# Patient Record
Sex: Male | Born: 1935 | Race: White | Hispanic: No | Marital: Married | Smoking: Former smoker
Health system: Southern US, Academic
[De-identification: ages and names within clinical notes are randomized; demographics above are authoritative.]

## PROBLEM LIST (undated history)

## (undated) DIAGNOSIS — H9190 Unspecified hearing loss, unspecified ear: Secondary | ICD-10-CM

## (undated) DIAGNOSIS — I10 Essential (primary) hypertension: Secondary | ICD-10-CM

## (undated) DIAGNOSIS — K219 Gastro-esophageal reflux disease without esophagitis: Secondary | ICD-10-CM

## (undated) DIAGNOSIS — J45909 Unspecified asthma, uncomplicated: Secondary | ICD-10-CM

## (undated) HISTORY — DX: Unspecified hearing loss, unspecified ear: H91.90

## (undated) HISTORY — DX: Unspecified asthma, uncomplicated: J45.909

## (undated) HISTORY — DX: Gastro-esophageal reflux disease without esophagitis: K21.9

## (undated) HISTORY — DX: Essential (primary) hypertension: I10

## (undated) NOTE — Progress Notes (Signed)
Formatting of this note is different from the original.    Nephrology f/u    Patient Name: Kurt Hayes Date of Service: 05/30/2022   Patient DOB: 1930-04-02 Provider : Lyla Glassing Provider Schedule   Patient MRN: [id number] Location: [place]     Reason for visit     No chief complaint on file.    History of Present Illness   Pt has stage 4 CKD. Pt noticeably hard of hearing but does not having hearing aids and has no desire for them. Patient reports his most recent BP reading was 124/65. Pt takes metoprolol 25 mg BID, telmisartan 80 mg.     No recent infections or hospitalizations. Denies N/V/D, fatigue, leg swelling.  No changes in weight and appetite. Drinks water throughout the day. Pt endorses some urinary urgency for about one month. No dysuria, hematuria, or foamy urine.   Produces adequate amount of urine.     Pt has hx of bladder cancer, has followed with oncology but reports his oncologist recently moved and he is unsure of his follow up plan. Next appointment is in [place] in June 13.     Past Medical History:   Diagnosis Date    Asthma     Chronic kidney disease stage 4 (HCC)     Chronic obstructive pulmonary disease ([place])     Essential hypertension     Gastroesophageal reflux disease     Hyperlipidemia     Osteoporosis     Stroke [facility])      Past Surgical History:   Procedure Laterality Date    OTHER SURGICAL HISTORY      polyps removed from nose x3, prostate surgery, salivary gland removed, tumor removed from bladder, non cancerous.     Social History     Tobacco Use    Smoking status: Unknown    Smokeless tobacco: Not on file   Substance Use Topics    Alcohol use: No     No family history on file.    Aspirin, Wheat bran, and Prednisone    No outpatient medications have been marked as taking for the 05/30/22 encounter (Office Visit) with [place].     Chemistry   Lab Units 05/27/22  0000 02/21/22  0000 11/21/21  0000 09/10/21  0000 08/13/21  0000 05/10/21  0000  10/27/20  0000 07/27/20  0000   SODIUM  144 145 145 144 145 145 143 146   POTASSIUM  4.1 3.9* 4.0 3.7 4.5 4.2 4.1 4.1   CHLORIDE  110.0* 110.0* 107.0 109.0* 107.0 109.0* 104.0 110*   CO2 mmol/L 19 17 23 21   --  23 21* 20*   BUN mg/dL 39* 39* 43* 37* 54* 30* 49* 41*   CREATININE mg/dL 2.39* 2.11* 1.98* 2.22* 2.60* 2.24* 2.17* 2.05*   EGFRNAFR  25  --  31  --   --   --   --  28   EGFRAFR   --   --   --   --   --   --   --  32   CALCIUM mg/dL 10.3 10.6* 10.8* 10.5 11.0* 10.8* 10.9* 10.8*   PHOSPHORUS  2.6  --  2.9 2.4  --  2.2 2.9  --    ALBUMIN g/dL 4.4 4.4 4.5 4.4 4.5 4.2 4.6 4.6     Lab Results   Component Value Date    WBC 7.7 05/27/2022    RBC 4.0 (L) 04/05/2019    HGB  13.1 (A) 05/27/2022    HCT 38.5 (A) 05/27/2022    MCV 97.0 02/21/2022    PLT 180 05/27/2022       05/30/2022   Urine Microscopy       Constitutional:  Negative for chills and fever.   HENT:  Negative for ear pain and tinnitus.    Eyes:  Negative for photophobia and pain.   Respiratory:  Negative for hemoptysis, shortness of breath and stridor.    Cardiovascular:  Negative for chest pain and leg swelling.   Gastrointestinal:  Negative for abdominal pain and melena.   Genitourinary:  Negative for frequency.   Musculoskeletal:  Negative for falls and myalgias.   Skin:  Negative for itching.   Neurological:  Negative for tingling, speech change and focal weakness.   Endo/Heme/Allergies:  Negative for environmental allergies. Does not bruise/bleed easily.   Psychiatric/Behavioral:  Negative for substance abuse and suicidal ideas.      BP 119/63 (BP Location: Left upper arm, Patient Position: Sitting)   Pulse 62   Ht 6' (1.829 m)   Wt 205 lb (93 kg)   BMI 27.80 kg/m     Constitutional: He is oriented to person, place, and time.   HEENT:   Nose: Nose normal.   Mouth/Throat: Oropharynx is clear and moist.   Eyes: EOM are normal. Pupils are equal, round, and reactive to light.   Neck: No tracheal deviation present. No thyroid mass present.    Cardiovascular:  Normal rate.      Exam reveals no friction rub.       Pulmonary/Chest: Effort normal. He exhibits no tenderness.   Abdominal: Soft. There is no guarding.   Musculoskeletal: He exhibits no deformity.   Neurological: He is alert and oriented to person, place, and time.   Skin: Skin is warm. He is not diaphoretic. No erythema.     Assessment & Plan     CKD 4  Cr is at 2.3 now with eGFr  25 , baseline is typically at eGFR 25-30   2/2 hypertensive nephrosclerosis  Discussed risks/ben of SGLT2i , he wants to think about it   Will check his renal function again in 6 mo and f/u    Met acidosis  Will give NaHco3 650 BID    Primary hyperparathyroidism  Asymptomatic   HyperCa, mild with low Phos and elevtaed PTH  Will defer to primary  I would just watch symptoms   Not a candidate for surgery     RTC: 6 months  Electronically signed by Fidela Salisbury, MD at 05/30/2022 11:56 AM EST

## (undated) NOTE — Progress Notes (Signed)
Formatting of this note is different from the original.  Images from the original note were not included.      [facility]    Kurt Hayes  DOB: 10-25-1929 (67 year old male)  MRN: [id number]    DATE OF CLINIC VISIT: 10/20/2020    PRIMARY CARE PHYSICIAN: Ralene Ok III, DO     PRIMARY CARDIOLOGIST: Dr. Ferrel Logan, MD        CHIEF COMPLAINT: Cardiac Arrythmia    HISTORY OF PRESENT ILLNESS: HPI  Kurt Hayes is a 31 year old male with a history of hypertension, chronic obstructive pulmonary disease, dyslipidemia.     Last Office visit was 02/14/2020 and no changes were made. Patient returns for a past due follow up of Cardiac Arrhythmia.    Patient states today he is doing well. He is accompanied to the visit by his wife.  Patient has a poor historian.  He has had no new problems since last office visit. He continues to have some instability in his gait when walking. He does not report any dizziness or pre-syncope. States that his blood pressure has been stable at home. He does report some numbness in bilateral feet.  Patient states that he sees neurologist next month for instability when walking.  Patient continues with shortness of breath at rest and upon exertion but this is unchanged since previous visit. Patient denies chest pain, tightness, or pressure. Denies N/V, fever, or chills. Denies syncope, near syncope, or palpitations. Denies edema, PND or orthopnea. Denies any TIA like symptoms. Denies any claudication like symptoms or any non healing wounds. Compliant with medications.     Cardiac Testing:  Echocardiogram on 12/13/2019  EF 65%  Grade 1 Diastolic Dysfunction  Mildly Dilated Left Atrial Cavity  Minimal MR  Minimal TR  Lexi MPI on   LHC on   Extended Holter Monitor on 11/28/2019  NSR over a period of 1 month    Cardiac History:   Patient Active Problem List    Diagnosis   ? Gait disturbance   ? Chronic obstructive pulmonary disease (HCC)   ? Hyperlipidemia, mixed   ? Asthma   ? Arrhythmia   ?  Dizziness     Note Last Updated: 12/06/2019     CDU: minimal disease. No stenosis present 10/06/2019    ? S/P TURP (status post transurethral resection of prostate)   ? Angina pectoris [facility])     Note Last Updated: 12/06/2019     Formatting of this note might be different from the original.  IMO Load 2016 R1.3    ? Dyspnea on exertion   ? Disequilibrium   ? Essential hypertension, benign     Pre-Visit Medications  Outpatient Medications Prior to Visit   Medication Sig Dispense Refill   ? albuterol (Proventil HFA/Ventolin HFA) 108 (90 Base) MCG/ACT inhaler Inhale 1-2 puffs into the lungs every 6 (six) hours as needed for wheezing.     ? albuterol (Proventil/Accuneb) 0.083% nebulizer solution Take 1 ampule by nebulization every 6 (six) hours.     ? allopurinol (ZYLOPRIM) 300 MG tablet Take 100 mg by mouth 2 (two) times a day.      ? bumetanide (Bumex) 1 MG tablet Take 1 mg by mouth daily.     ? cholecalciferol (Vitamin D3) 50 MCG (2000 UT) tablet Take 50 mcg by mouth daily.     ? clopidogrel (PLAVIX) 75 MG tablet Take 75 mg by mouth daily.     ? Coenzyme Q10 (  CoQ10) 100 MG Cap Take 1 tablet by mouth daily.     ? finasteride (PROSCAR) 5 MG tablet Take 5 mg by mouth daily.     ? fluticasone-vilanterol (Breo Ellipta) 100-25 MCG/INH inhaler Inhale 1 puff into the lungs daily. Rinse mouth with water after inhalation     ? lamoTRIgine (LaMICtal) 25 MG tablet Take 25 mg by mouth 2 (two) times a day.     ? Magnesium 400 MG Tab Take 1 tablet by mouth daily.     ? metoprolol succinate (Toprol-XL) 50 MG 24 hr tablet Take 25 mg by mouth 2 (two) times a day.     ? Misc Natural Products (OSTEO BI-FLEX TRIPLE STRENGTH PO) Take 1 tablet by mouth daily.     ? Multiple Vitamins-Minerals (OCUVITE PO) Take 1 tablet by mouth daily.     ? nitroglycerin (NITROSTAT) 0.4 MG SL tablet Place 0.4 mg under the tongue every 5 (five) minutes as needed for chest pain. If no relief after 3 doses, call 911.     ? rosuvastatin (Crestor) 20 MG tablet Take  20 mg by mouth daily.     ? sucralfate (Carafate) 1 g tablet Take 1 g by mouth 4 (four) times a day.     ? telmisartan (Micardis) 80 MG tablet Take 80 mg by mouth daily.     ? FLUoxetine (PROzac) 20 MG capsule Take 20 mg by mouth daily.     ? budesonide-formoterol (Symbicort) 160-4.5 MCG/ACT inhaler Inhale 2 puffs into the lungs 2 (two) times a day.     ? gabapentin (Neurontin) 100 MG capsule Take 100 mg by mouth 3 (three) times a day.     ? losartan (Cozaar) 50 MG tablet Take 50 mg by mouth daily.     ? pantoprazole (PROTONIX) 40 MG tablet Take 40 mg by mouth every morning before breakfast.     ? tiotropium-olodaterol (Stiolto Respimat) 2.5-2.5 MCG/ACT inhaler Inhale 2 puffs into the lungs daily.       Other Past History:  Past Medical History:   Diagnosis Date   ? Acid reflux    ? Asthma    ? Cerebrovascular accident [facility])    ? Chronic obstructive pulmonary disease (HCC)    ? Depression    ? Gout    ? Hiatal hernia    ? Hypercholesteremia    ? Hypertensive disorder    ? Lactose intolerance    ? Seasonal allergies      Past Surgical History:   Procedure Laterality Date   ? BLADDER TUMOR EXCISION     ? COLONOSCOPY     ? ESOPHAGOGASTRODUODENOSCOPY     ? ESOPHAGOGASTRODUODENOSCOPY N/A 04/19/2015    Procedure: EGD ;  Surgeon: Frankey Shown, MD;  Location: [facility];  Service: Gastroenterology;  Laterality: N/A;   ? nasal polyps     ? PROSTATE SURGERY       Family History   Problem Relation Age of Onset   ? Heart attack Mother    ? Heart disease Mother    ? Heart attack Father    ? Heart disease Father    ? Heart attack Sister    ? Heart attack Sister    ? Stroke Sister    ? Cancer Sister      Social History     Tobacco Use   ? Smoking status: Former Smoker     Packs/day: 1.00     Years: 15.00     Pack years:  15.00     Types: Cigarettes     Quit date: 04/18/1995     Years since quitting: 25.5   ? Smokeless tobacco: Never Used   Substance Use Topics   ? Alcohol use: No     Allergies   Allergen Reactions    ? Aspirin Shortness Of Breath     "asthma"   ? Wheat Bran Shortness Of Breath     "asthma"     Labs/Xrays, pertinent studies, and any available records were reviewed and results/ impressions were discussed with patient in detail.    Review of Systems:   Review of Systems   Constitutional: Negative for fever, night sweats and weight gain.   HENT: Negative for nosebleeds and tinnitus.    Eyes: Negative for double vision.   Cardiovascular: Positive for dyspnea on exertion. Negative for chest pain, claudication, irregular heartbeat, leg swelling, near-syncope, orthopnea, palpitations, paroxysmal nocturnal dyspnea and syncope.   Respiratory: Positive for shortness of breath. Negative for cough.    Hematologic/Lymphatic: Negative for bleeding problem. Does not bruise/bleed easily.   Skin: Negative for poor wound healing.   Musculoskeletal: Negative for myalgias.   Gastrointestinal: Negative for hematemesis, hematochezia and melena.   Genitourinary: Negative for nocturia.   Neurological: Positive for disturbances in coordination. Negative for excessive daytime sleepiness, dizziness, headaches and light-headedness.   Psychiatric/Behavioral: Positive for memory loss. Negative for depression. The patient is not nervous/anxious.    Allergic/Immunologic: Negative.      Vital Signs:   Measurements Weight: 90.6 kg (199 lb 12.8 oz), Height: 6' (182.9 cm), BSA (Calculated - sq m): 2.15 sq meters, BMI (Calculated): 27.1    Vitals BP: 110/62, BP Location: Right arm, Patient Position: Sitting, Pulse: 69, Resp: 18, Temp: 98.4 F (36.9 C)    Pulse Oximetry Room Air At Rest: 96    Pain Assessment Pain Score: 0     PHYSICAL EXAM:   Physical Exam  Vitals and nursing note reviewed.   Constitutional:       General: He is not in acute distress.     Appearance: Normal appearance. He is well-developed.   HENT:      Head: Normocephalic and atraumatic.   Eyes:      General: No scleral icterus.     Conjunctiva/sclera: Conjunctivae normal.       Pupils: Pupils are equal, round, and reactive to light.   Neck:      Vascular: No JVD.   Cardiovascular:      Rate and Rhythm: Normal rate and regular rhythm.      Pulses:           Carotid pulses are 2+ on the right side and 2+ on the left side.       Radial pulses are 2+ on the right side and 2+ on the left side.        Posterior tibial pulses are 2+ on the right side and 2+ on the left side.      Heart sounds: No murmur heard.    No friction rub. No gallop.   Pulmonary:      Effort: Pulmonary effort is normal.      Breath sounds: Normal breath sounds.   Abdominal:      Palpations: Abdomen is soft.   Musculoskeletal:         General: Normal range of motion.      Cervical back: Normal range of motion.      Right lower leg: No edema.  Left lower leg: No edema.   Skin:     General: Skin is warm and dry.   Neurological:      Mental Status: He is alert and oriented to person, place, and time.   Psychiatric:         Behavior: Behavior normal.         Thought Content: Thought content normal.         Judgment: Judgment normal.     Visit Diagnoses:    1. Other cardiac arrhythmia  Follows up with Neurology in [facility].    Will follow-up in 6 months, or sooner if needed.    2. Essential hypertension, benign  BP 110/62 well controlled, continue antihypertensives. Home BP monitoring encouraged. BP will be re-assessed at next clinic visit.     - EKG 12 lead; AFlutter rate of 69    3. Angina pectoris [facility])  Patient denies any chest pain related to his heart, he states this could be caused by his kidney issues but he is unsure.    4. Hyperlipidemia, mixed  Tolerating Statin. Followed by PCP    Summary and Recommendations:   The current medical regimen is effective;  continue present plan and medications.   I reviewed my assessment and plans for follow up in detail with the patient and/or family who verbalized an understanding and agrees with them    Activities:  As tolerated  and focus on diet and exercise as discussed    Final  Med List:  Outpatient Encounter Medications as of 10/20/2020   Medication Sig Dispense Refill   ? albuterol (Proventil HFA/Ventolin HFA) 108 (90 Base) MCG/ACT inhaler Inhale 1-2 puffs into the lungs every 6 (six) hours as needed for wheezing.     ? albuterol (Proventil/Accuneb) 0.083% nebulizer solution Take 1 ampule by nebulization every 6 (six) hours.     ? allopurinol (ZYLOPRIM) 300 MG tablet Take 100 mg by mouth 2 (two) times a day.      ? bumetanide (Bumex) 1 MG tablet Take 1 mg by mouth daily.     ? cholecalciferol (Vitamin D3) 50 MCG (2000 UT) tablet Take 50 mcg by mouth daily.     ? clopidogrel (PLAVIX) 75 MG tablet Take 75 mg by mouth daily.     ? Coenzyme Q10 (CoQ10) 100 MG Cap Take 1 tablet by mouth daily.     ? finasteride (PROSCAR) 5 MG tablet Take 5 mg by mouth daily.     ? fluticasone-vilanterol (Breo Ellipta) 100-25 MCG/INH inhaler Inhale 1 puff into the lungs daily. Rinse mouth with water after inhalation     ? lamoTRIgine (LaMICtal) 25 MG tablet Take 25 mg by mouth 2 (two) times a day.     ? Magnesium 400 MG Tab Take 1 tablet by mouth daily.     ? metoprolol succinate (Toprol-XL) 50 MG 24 hr tablet Take 25 mg by mouth 2 (two) times a day.     ? Misc Natural Products (OSTEO BI-FLEX TRIPLE STRENGTH PO) Take 1 tablet by mouth daily.     ? Multiple Vitamins-Minerals (OCUVITE PO) Take 1 tablet by mouth daily.     ? nitroglycerin (NITROSTAT) 0.4 MG SL tablet Place 0.4 mg under the tongue every 5 (five) minutes as needed for chest pain. If no relief after 3 doses, call 911.     ? rosuvastatin (Crestor) 20 MG tablet Take 20 mg by mouth daily.     ? sucralfate (Carafate) 1 g tablet Take 1 g  by mouth 4 (four) times a day.     ? telmisartan (Micardis) 80 MG tablet Take 80 mg by mouth daily.     ? [DISCONTINUED] FLUoxetine (PROzac) 20 MG capsule Take 20 mg by mouth daily.     ? [DISCONTINUED] budesonide-formoterol (Symbicort) 160-4.5 MCG/ACT inhaler Inhale 2 puffs into the lungs 2 (two) times a day.     ?  [DISCONTINUED] gabapentin (Neurontin) 100 MG capsule Take 100 mg by mouth 3 (three) times a day.     ? [DISCONTINUED] losartan (Cozaar) 50 MG tablet Take 50 mg by mouth daily.     ? [DISCONTINUED] pantoprazole (PROTONIX) 40 MG tablet Take 40 mg by mouth every morning before breakfast.     ? [DISCONTINUED] tiotropium-olodaterol (Stiolto Respimat) 2.5-2.5 MCG/ACT inhaler Inhale 2 puffs into the lungs daily.     Last reviewed on 10/20/2020  2:08 PM by Rowe Pavy      Follow-up:  No follow-ups on file.    Portions of this chart may have been created with MModal voice recognition software.  Occasional wrong-word or ?sound-alike? substitutions may have occurred due to the inherent limitations of voice recognition software, and may have been missed even after my proofreading. Please read the chart carefully and recognize, using context, where these substitutions have occurred.     Electronically signed by:  Eula Listen, NP  10/20/2020      Electronically signed by Hans Eden, NP at 10/20/2020  3:04 PM EDT

---

## 2000-02-11 DIAGNOSIS — I679 Cerebrovascular disease, unspecified: Secondary | ICD-10-CM | POA: Insufficient documentation

## 2000-02-26 DIAGNOSIS — I10 Essential (primary) hypertension: Secondary | ICD-10-CM | POA: Insufficient documentation

## 2000-04-07 DIAGNOSIS — R42 Dizziness and giddiness: Secondary | ICD-10-CM | POA: Insufficient documentation

## 2002-02-23 DIAGNOSIS — Z79899 Other long term (current) drug therapy: Secondary | ICD-10-CM | POA: Insufficient documentation

## 2002-02-23 DIAGNOSIS — R918 Other nonspecific abnormal finding of lung field: Secondary | ICD-10-CM | POA: Insufficient documentation

## 2003-08-26 DIAGNOSIS — M546 Pain in thoracic spine: Secondary | ICD-10-CM | POA: Insufficient documentation

## 2004-04-30 DIAGNOSIS — M549 Dorsalgia, unspecified: Secondary | ICD-10-CM | POA: Insufficient documentation

## 2004-08-13 DIAGNOSIS — I209 Angina pectoris, unspecified: Secondary | ICD-10-CM | POA: Insufficient documentation

## 2005-01-29 DIAGNOSIS — J13 Pneumonia due to Streptococcus pneumoniae: Secondary | ICD-10-CM | POA: Insufficient documentation

## 2006-08-29 DIAGNOSIS — J329 Chronic sinusitis, unspecified: Secondary | ICD-10-CM | POA: Insufficient documentation

## 2010-08-23 DEATH — deceased

## 2015-10-30 DIAGNOSIS — Z9079 Acquired absence of other genital organ(s): Secondary | ICD-10-CM | POA: Insufficient documentation

## 2015-10-30 DIAGNOSIS — N401 Enlarged prostate with lower urinary tract symptoms: Secondary | ICD-10-CM | POA: Insufficient documentation

## 2017-10-13 DIAGNOSIS — R0600 Dyspnea, unspecified: Secondary | ICD-10-CM | POA: Insufficient documentation

## 2017-12-01 DIAGNOSIS — N289 Disorder of kidney and ureter, unspecified: Secondary | ICD-10-CM | POA: Insufficient documentation

## 2017-12-01 DIAGNOSIS — M109 Gout, unspecified: Secondary | ICD-10-CM | POA: Insufficient documentation

## 2017-12-01 DIAGNOSIS — I10 Essential (primary) hypertension: Secondary | ICD-10-CM | POA: Insufficient documentation

## 2017-12-01 DIAGNOSIS — J449 Chronic obstructive pulmonary disease, unspecified: Secondary | ICD-10-CM | POA: Insufficient documentation

## 2019-09-13 DIAGNOSIS — L988 Other specified disorders of the skin and subcutaneous tissue: Secondary | ICD-10-CM | POA: Insufficient documentation

## 2019-10-28 ENCOUNTER — Ambulatory Visit (HOSPITAL_COMMUNITY): Payer: Self-pay

## 2019-12-06 DIAGNOSIS — E782 Mixed hyperlipidemia: Secondary | ICD-10-CM | POA: Insufficient documentation

## 2019-12-06 DIAGNOSIS — J45909 Unspecified asthma, uncomplicated: Secondary | ICD-10-CM | POA: Insufficient documentation

## 2019-12-06 DIAGNOSIS — I499 Cardiac arrhythmia, unspecified: Secondary | ICD-10-CM | POA: Insufficient documentation

## 2020-02-02 IMAGING — MR MRI LUMBAR SPINE WITHOUT CONTRAST
5 of 6 series · 32 of 48 positions shown · IV contrast (gadolinium)
Comparison: None available.

﻿EXAM:  MRI LUMBAR SPINE WITHOUT CONTRAST
INDICATION: Lower back pain with bilateral lower extremity radiculopathy.
TECHNIQUE: Multiplanar multisequential MRI of the lumbar spine was performed without gadolinium contrast.

[Series 9: T2 · sagittal · 4.0mm · 0.94mm/px · 6 of 13 slices shown (1 of 3)]
[im 1/13]
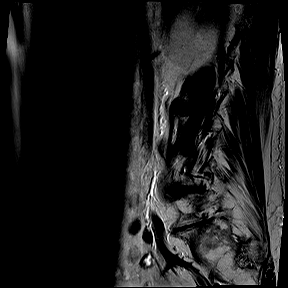
[im 3/13]
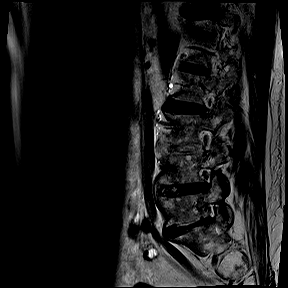
[im 5/13]
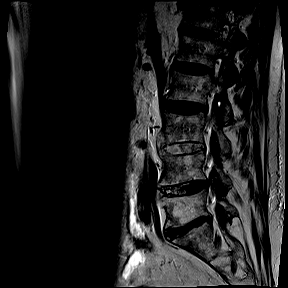
[im 8/13]
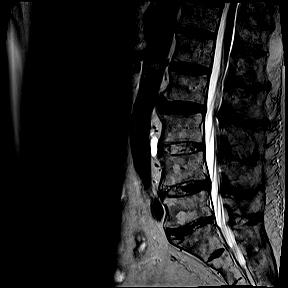
[im 10/13]
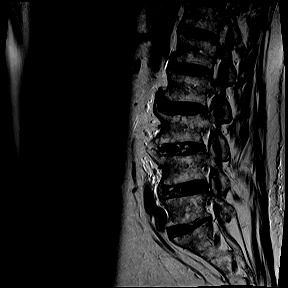
[im 13/13]
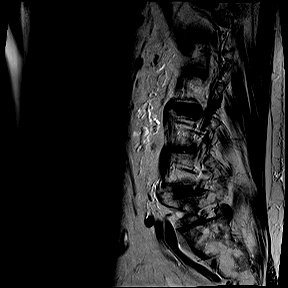

[Series 10: T1 · sagittal · 4.0mm · 0.94mm/px · 6 of 13 slices shown (1 of 2)]
[im 1/13]
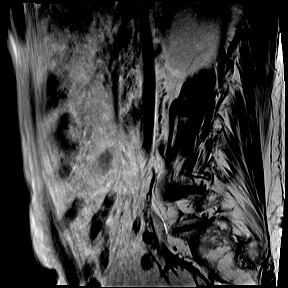
[im 3/13]
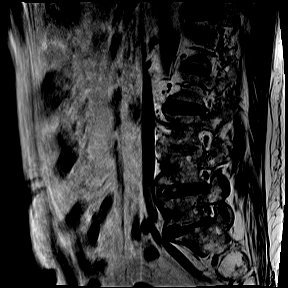
[im 5/13]
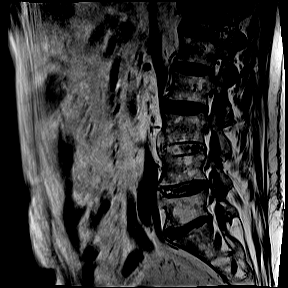
[im 8/13]
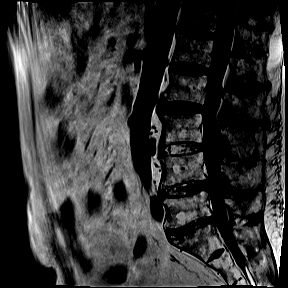
[im 10/13]
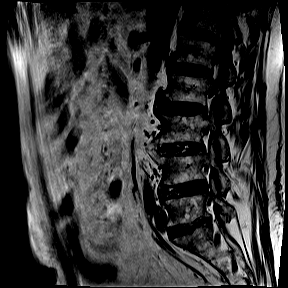
[im 13/13]
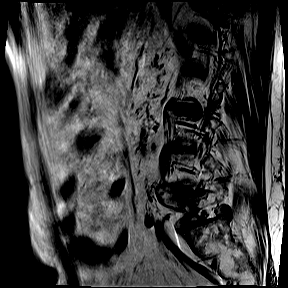

[Series 12: T2 · axial · 4.0mm · 0.47mm/px · z∈[-107,+80]mm · 8 of 23 slices shown (2 of 3)]
[im 1/23]
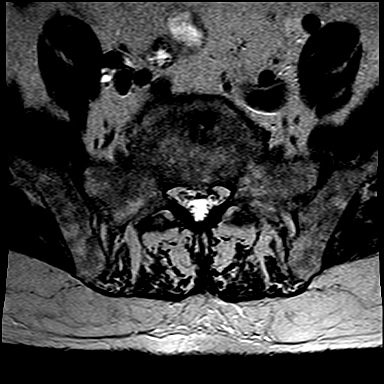
[im 3/23]
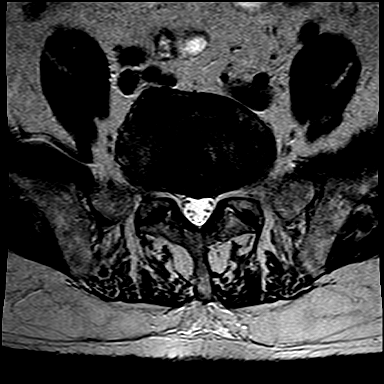
[im 8/23]
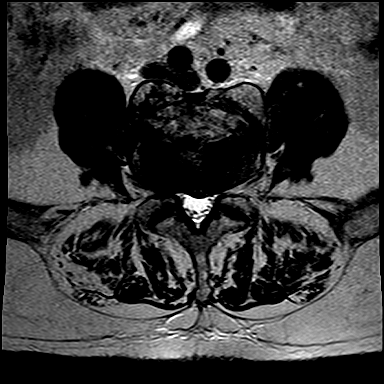
[im 10/23]
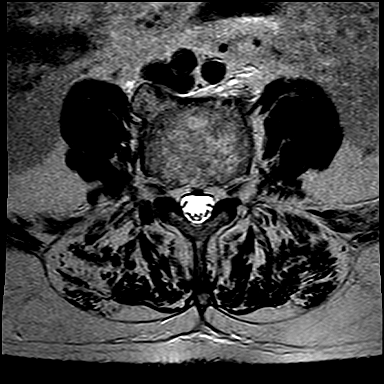
[im 13/23]
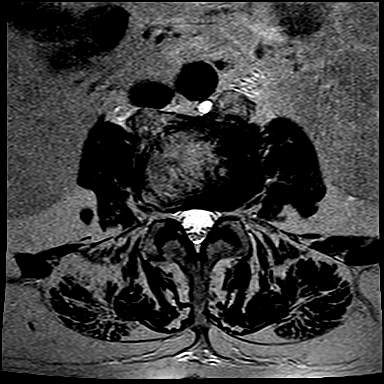
[im 15/23]
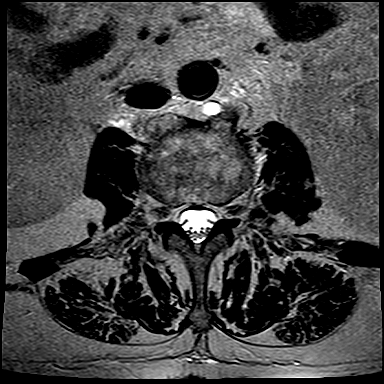
[im 20/23]
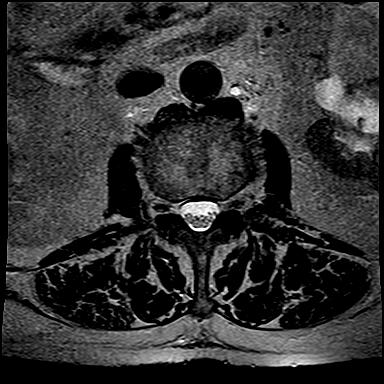
[im 23/23]
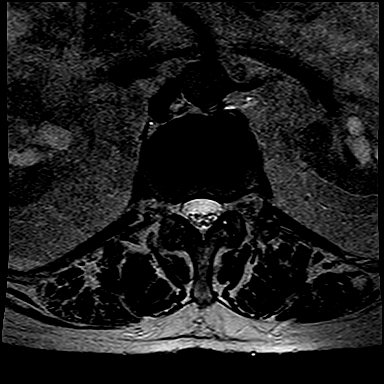

[Series 13: T1 · axial · 4.0mm · 0.47mm/px · z∈[-107,-45]mm · 4 of 23 slices shown (2 of 2)]
[im 1/23]
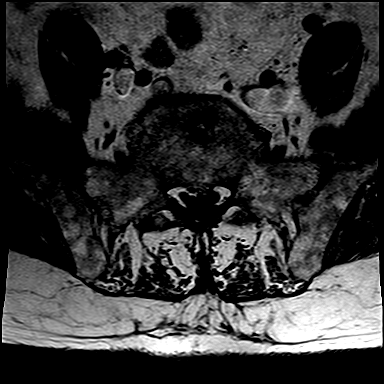
[im 3/23]
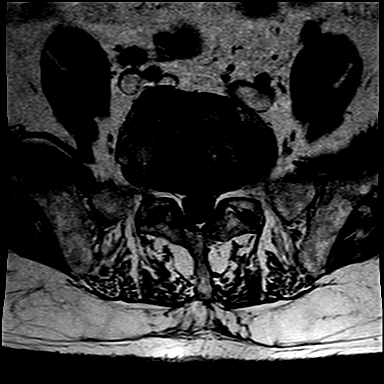
[im 8/23]
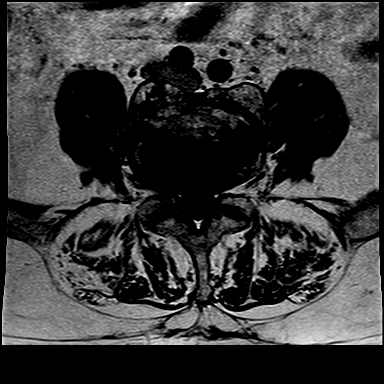
[im 10/23]
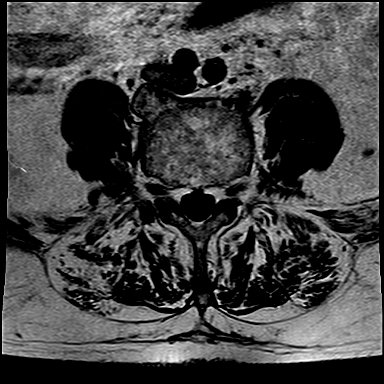

[Series 15: T2 · coronal · 5.0mm · 0.82mm/px · 8 of 22 slices shown (3 of 3)]
[im 1/22]
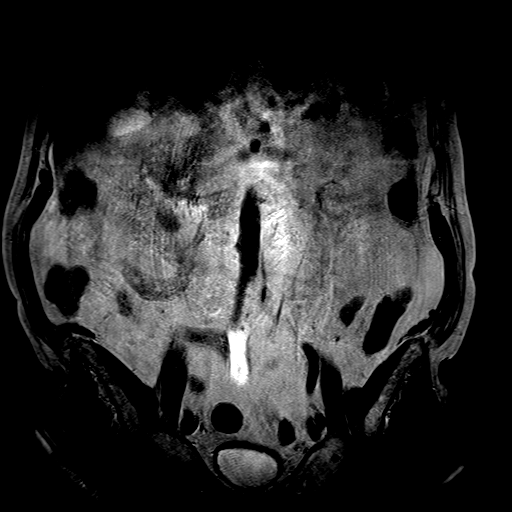
[im 3/22]
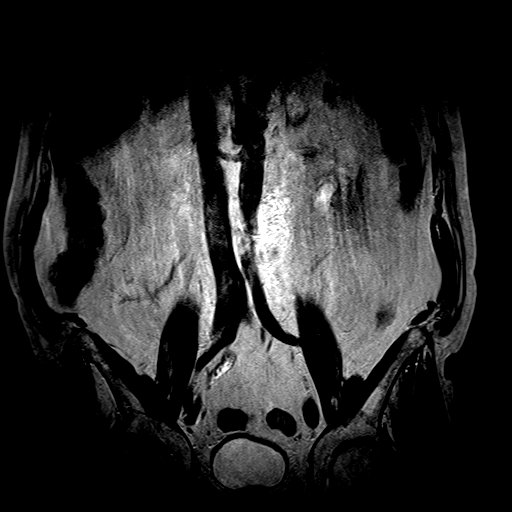
[im 8/22]
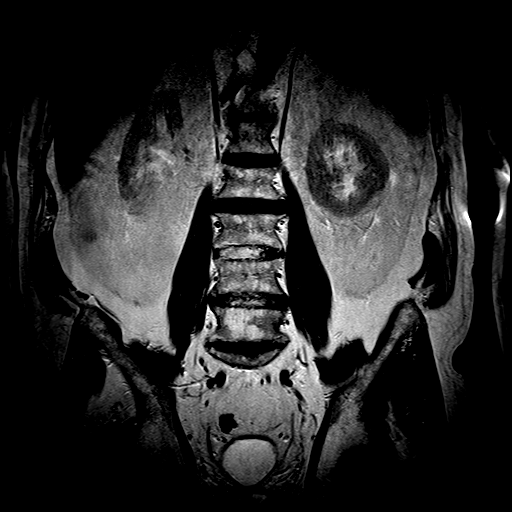
[im 10/22]
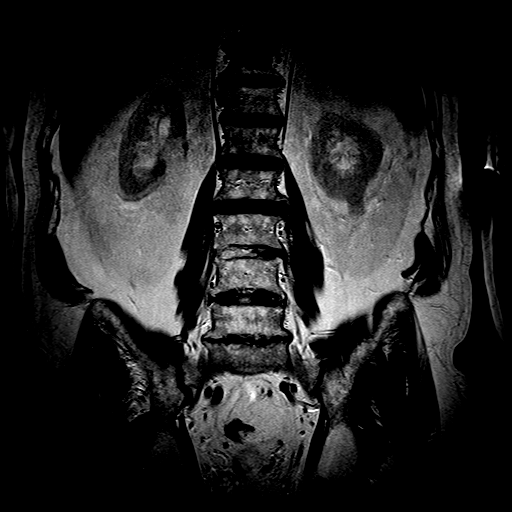
[im 12/22]
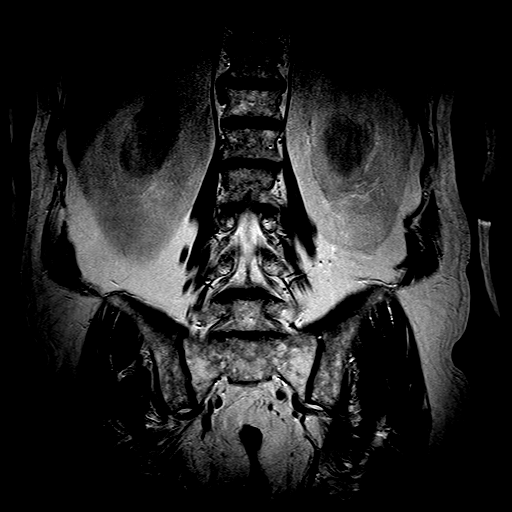
[im 15/22]
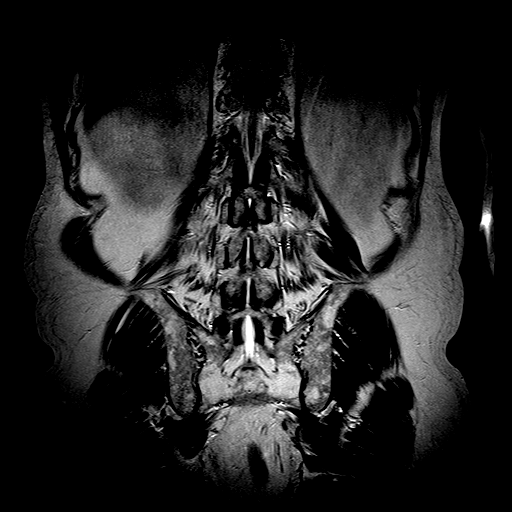
[im 19/22]
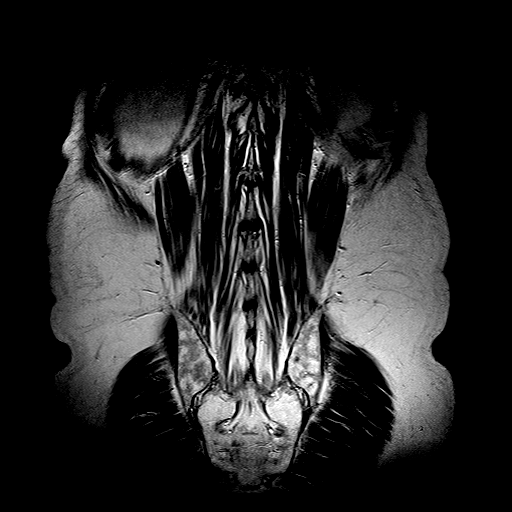
[im 22/22]
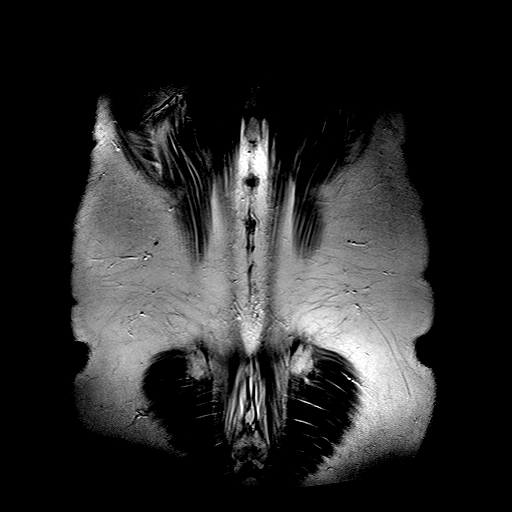

[32 of 48 positions shown; findings below may reference images not displayed]

FINDINGS: Vertebral bodies are normal in height, alignment and signal intensity. There is no acute fracture or subluxation. Distal spinal cord is normal in signal intensity and terminates normally at T12-L1 disc space level. Spinal canal is congenitally narrow.

L1-2 level is unremarkable.

At L2-3 level, there is mild bilateral neural foraminal stenosis from facet arthropathy and bulging annulus without nerve root impingement.

L3-4 level is unremarkable.

At L4-5 level, there is a small broad-based central disc bulge, mildly effacing the ventral thecal sac. There is moderate bilateral neural foraminal stenosis from facet arthropathy and bulging annulus.

At L5-S1 level, there is moderate disc desiccation. There is a minimal bulging annulus, minimally effacing the ventral thecal sac. There is mild bilateral neural foraminal stenosis from facet arthropathy and bulging annulus without nerve root impingement.

Paraspinal soft tissues are unremarkable. There is an indeterminate 12 mm left kidney lower pole exophytic nodule.
IMPRESSION: 1. Small central disc bulge at L4-5 level, mildly effacing the ventral thecal sac. 

2. Multilevel neural foraminal stenosis as detailed above. 

3. An indeterminate 12 mm left kidney lower pole exophytic nodule. Follow-up renal sonogram is recommended for better assessment.

## 2020-02-14 DIAGNOSIS — R269 Unspecified abnormalities of gait and mobility: Secondary | ICD-10-CM | POA: Insufficient documentation

## 2020-02-29 IMAGING — US US RETROPERITONEAL COMPLETE
1 series · 14 of 25 positions shown · non-contrast
Comparison: MRI of the lumbar spine dated 02/02/2020.

﻿EXAM:  US RETROPERITONEAL COMPLETE
INDICATION: Left renal mass.

[Series 1: us retroperitoneal complete · 14 of 63 slices shown]
[im 1/63]
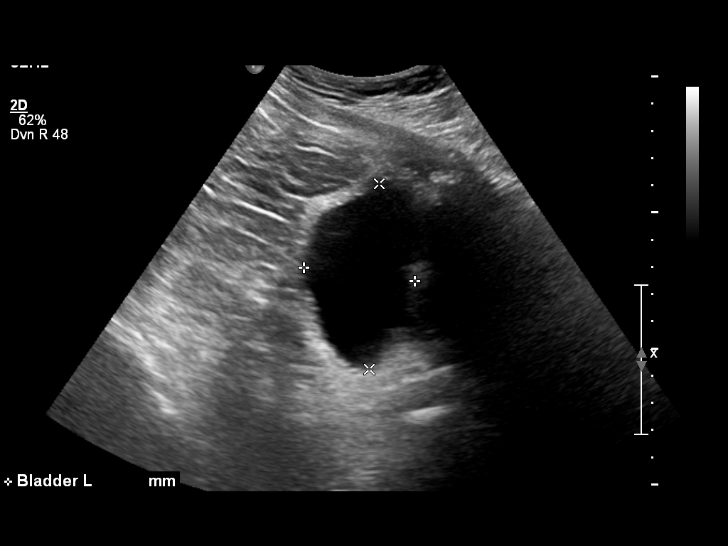
[im 6/63]
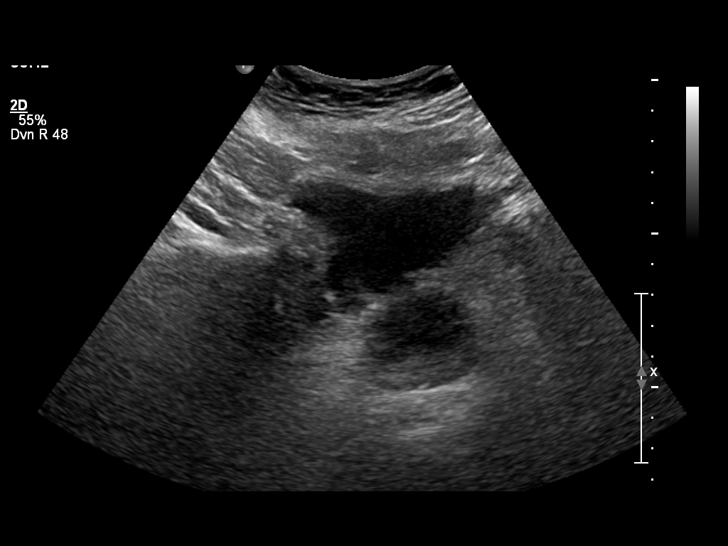
[im 11/63]
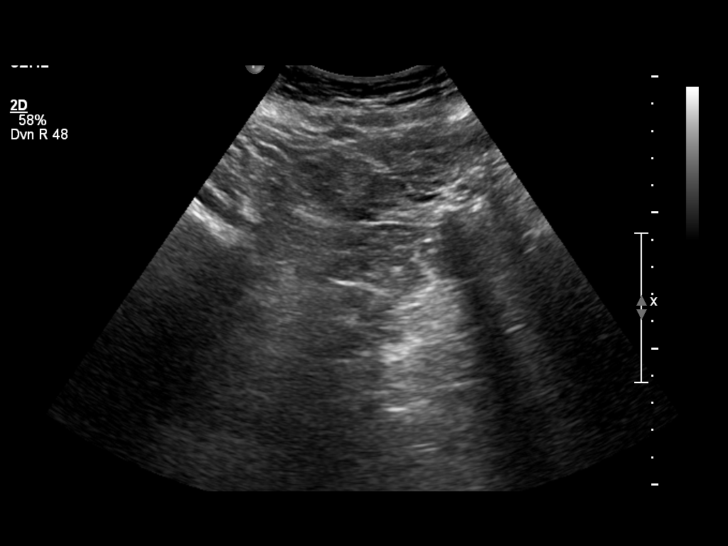
[im 16/63]
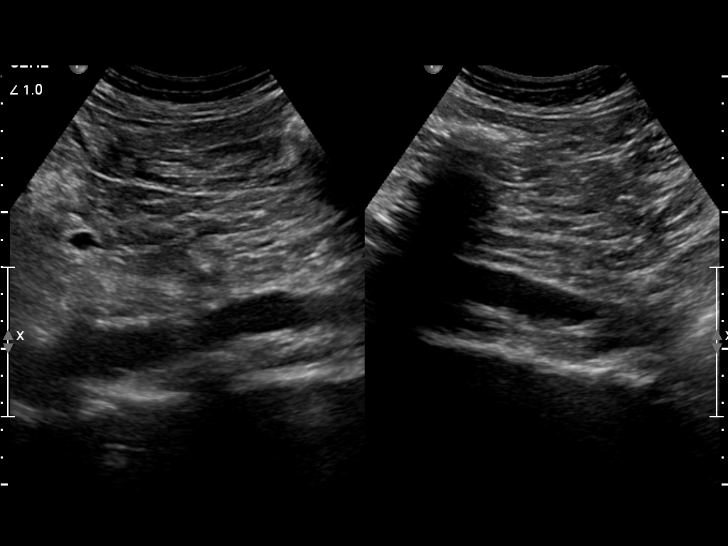
[im 21/63]
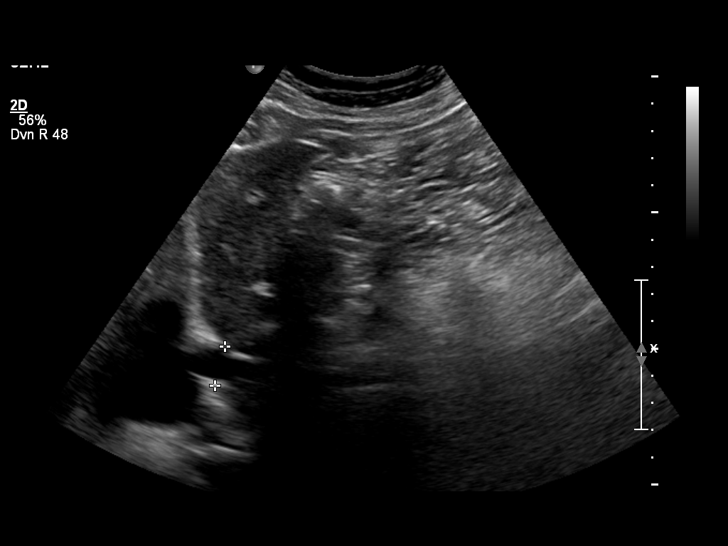
[im 24/63]
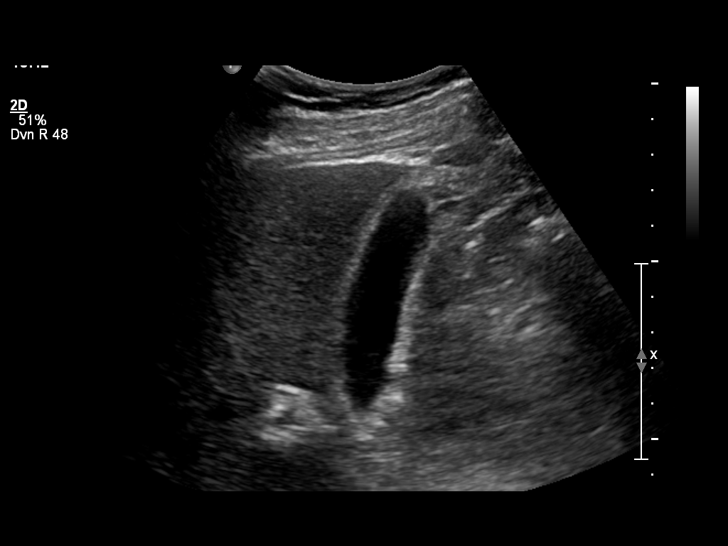
[im 29/63]
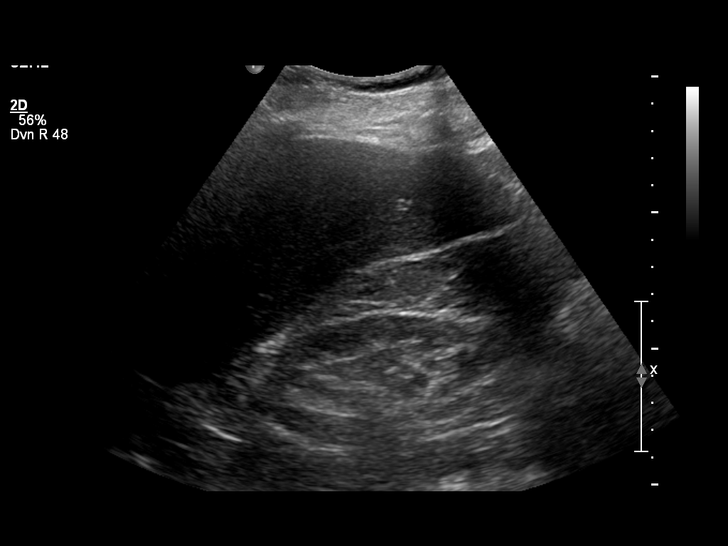
[im 34/63]
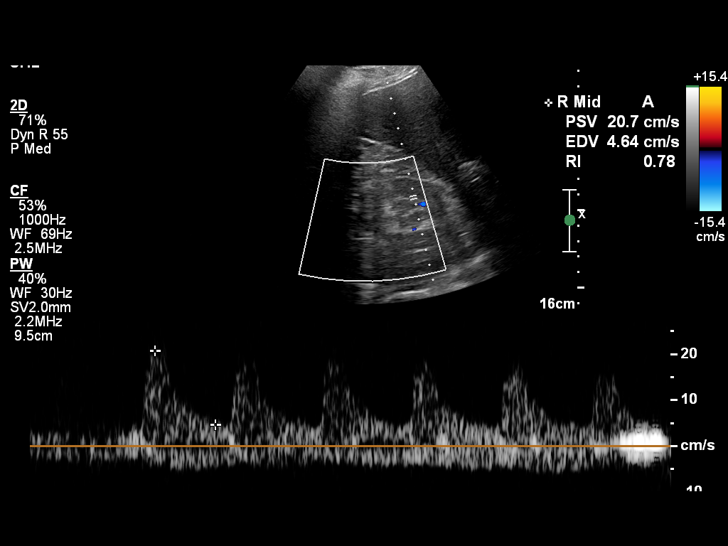
[im 39/63]
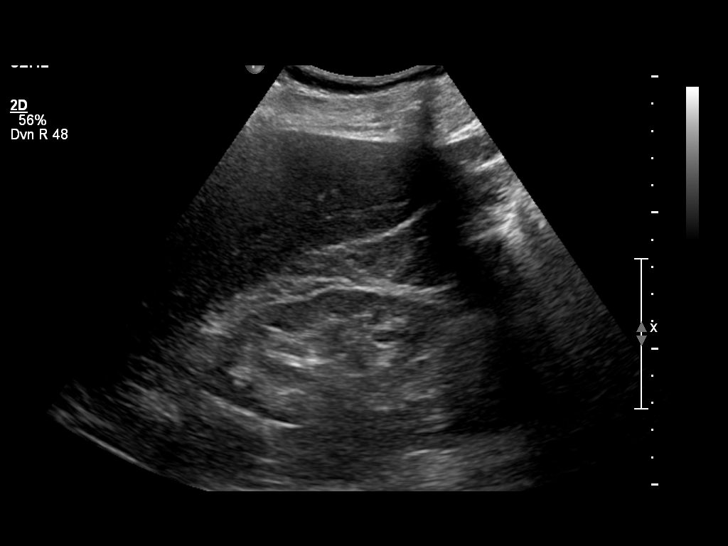
[im 42/63]
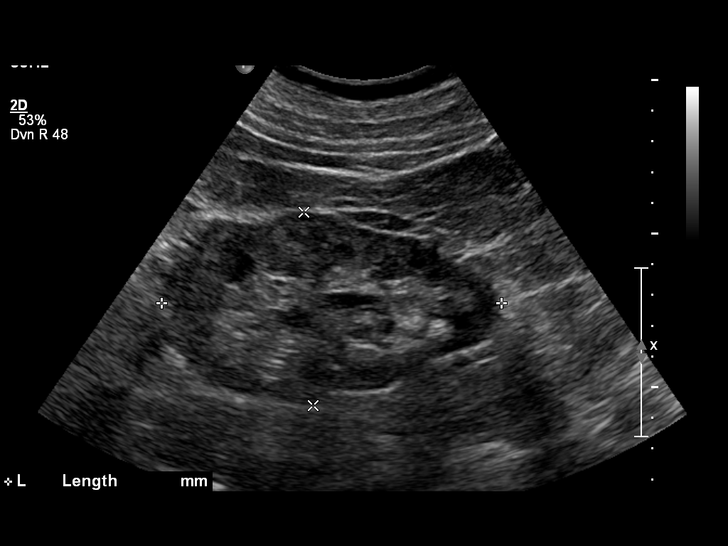
[im 47/63]
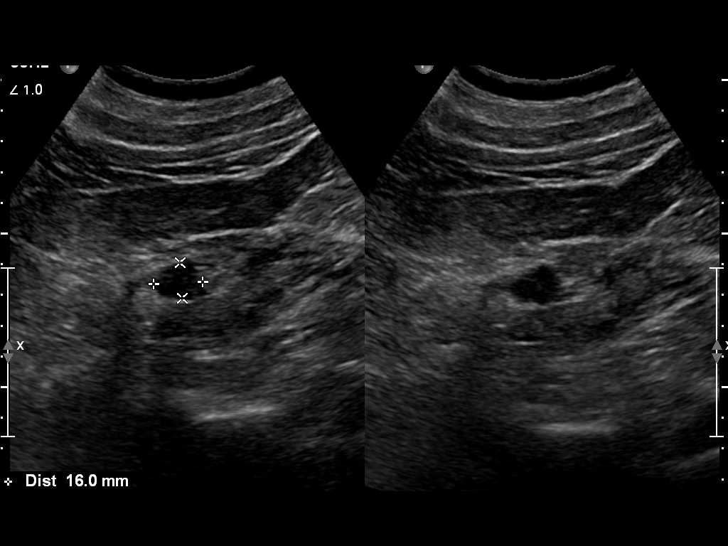
[im 52/63]
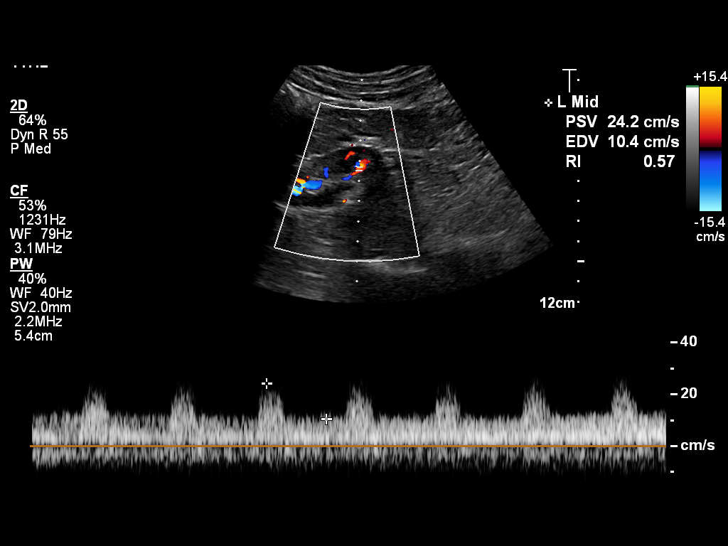
[im 57/63]
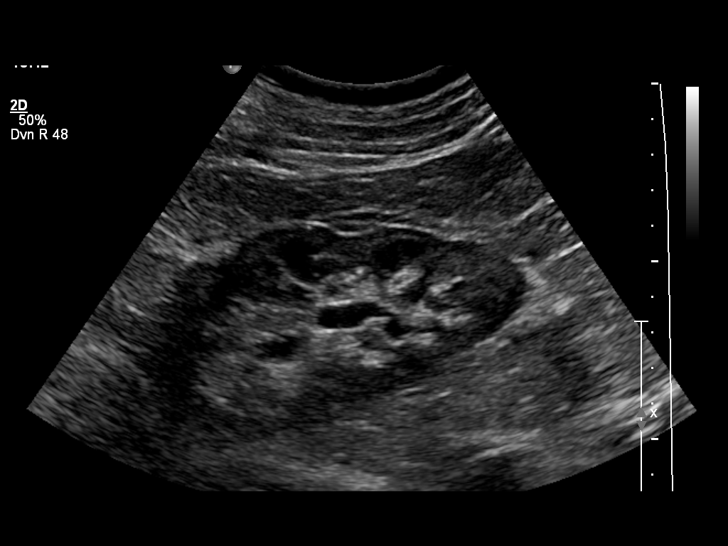
[im 63/63]
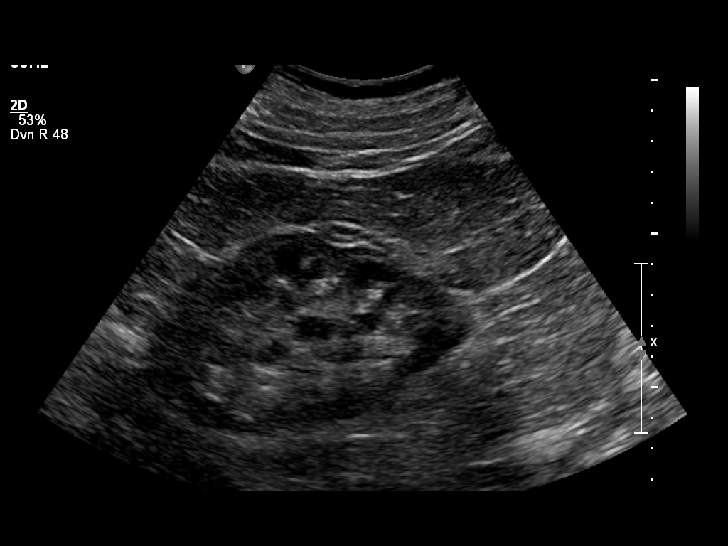

[14 of 25 positions shown; findings below may reference images not displayed]

FINDINGS: Kidneys are normal in echogenicity. Right kidney measures 10 cm and left kidney measures 11 cm. There is a 15 mm left kidney lower pole cyst. There is no hydronephrosis, mass or shadowing calculus on either side.

Urinary bladder is partially distended and grossly unremarkable.
IMPRESSION: A 15 mm left kidney lower pole cyst, otherwise unremarkable exam.

## 2020-05-09 DIAGNOSIS — N2581 Secondary hyperparathyroidism of renal origin: Secondary | ICD-10-CM | POA: Insufficient documentation

## 2020-05-09 DIAGNOSIS — M810 Age-related osteoporosis without current pathological fracture: Secondary | ICD-10-CM | POA: Insufficient documentation

## 2020-05-09 DIAGNOSIS — D631 Anemia in chronic kidney disease: Secondary | ICD-10-CM | POA: Insufficient documentation

## 2020-05-09 DIAGNOSIS — N184 Chronic kidney disease, stage 4 (severe): Secondary | ICD-10-CM | POA: Insufficient documentation

## 2020-05-09 DIAGNOSIS — I129 Hypertensive chronic kidney disease with stage 1 through stage 4 chronic kidney disease, or unspecified chronic kidney disease: Secondary | ICD-10-CM | POA: Insufficient documentation

## 2020-11-08 DIAGNOSIS — H2640 Unspecified secondary cataract: Secondary | ICD-10-CM | POA: Insufficient documentation

## 2020-11-08 DIAGNOSIS — H4450 Unspecified degenerated conditions of globe: Secondary | ICD-10-CM | POA: Insufficient documentation

## 2022-05-29 IMAGING — MR MRI BRAIN W/O CONTRAST
9 series · 48 of 48 positions shown · non-contrast
Comparison: None available.

﻿EXAM:  MRI BRAIN W/O CONTRAST
INDICATION: [AGE] male with history of dementia. Dizziness. Left-sided facial pain.
TECHNIQUE: Multiplanar, multisequential MRI of the brain was performed without IV contrast.

[Series 5: DWI · axial · 5.0mm · 1.35mm/px · z∈[-36,+90]mm · 16 of 88 slices shown (1 of 3)]
[im 1/88]
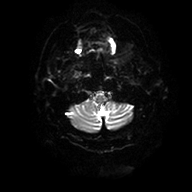
[im 6/88]
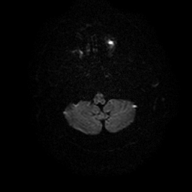
[im 12/88]
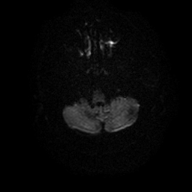
[im 18/88]
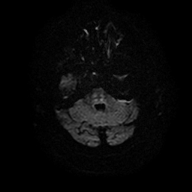
[im 24/88]
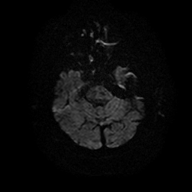
[im 30/88]
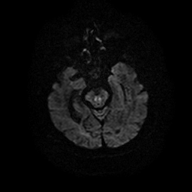
[im 35/88]
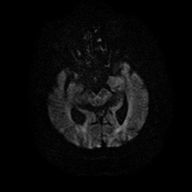
[im 41/88]
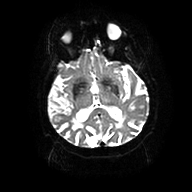
[im 47/88]
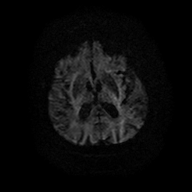
[im 53/88]
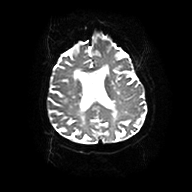
[im 59/88]
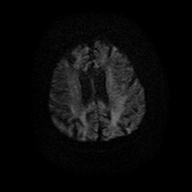
[im 64/88]
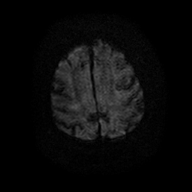
[im 70/88]
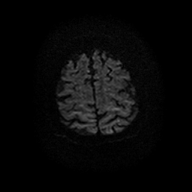
[im 76/88]
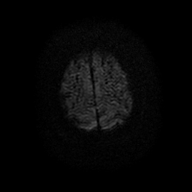
[im 82/88]
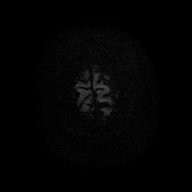
[im 88/88]
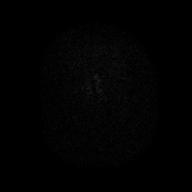

[Series 6: DWI · axial · 5.0mm · 1.35mm/px · z∈[-36,+90]mm · 4 of 22 slices shown (2 of 3)]
[im 1/22]
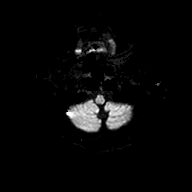
[im 8/22]
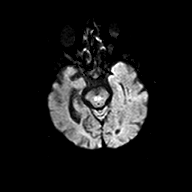
[im 15/22]
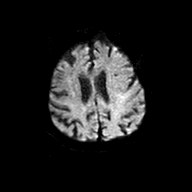
[im 22/22]
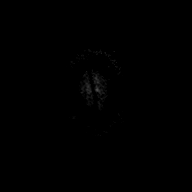

[Series 7: DWI · axial · 5.0mm · 1.35mm/px · z∈[-36,+90]mm · 4 of 22 slices shown (3 of 3)]
[im 1/22]
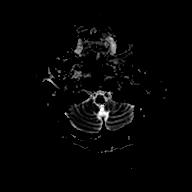
[im 8/22]
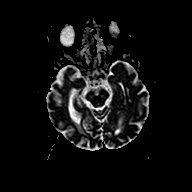
[im 15/22]
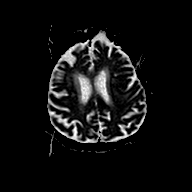
[im 22/22]
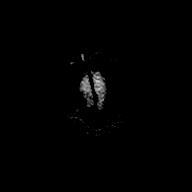

[Series 8: FLAIR · sagittal · 4.0mm · 0.75mm/px · 4 of 26 slices shown (1 of 2)]
[im 1/26]
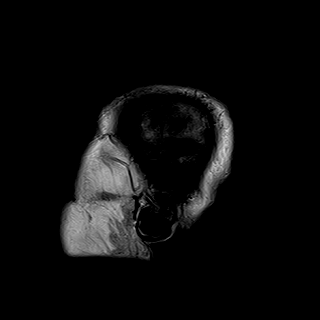
[im 9/26]
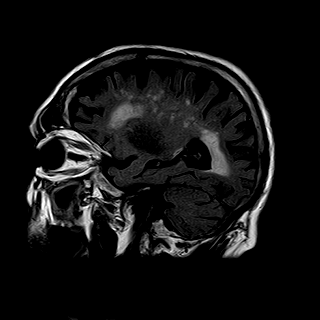
[im 17/26]
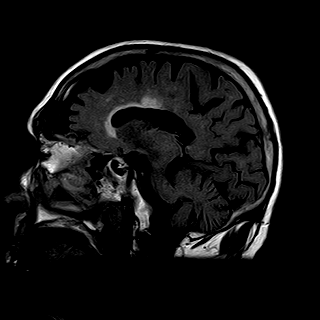
[im 26/26]
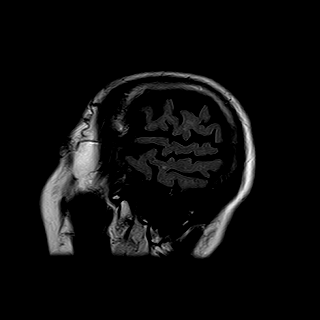

[Series 9: T2 · axial · 5.0mm · 0.43mm/px · z∈[-41,+102]mm · 4 of 25 slices shown (1 of 2)]
[im 1/25]
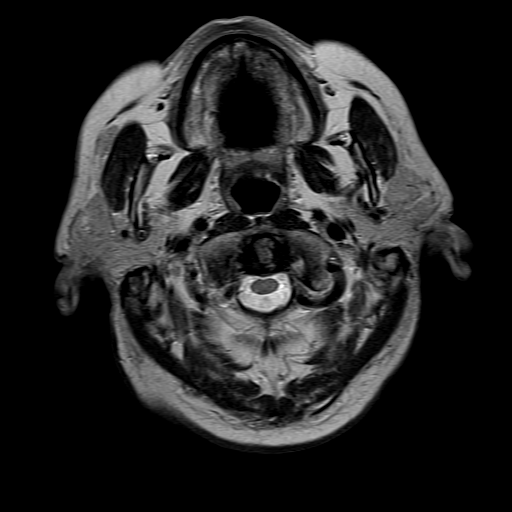
[im 9/25]
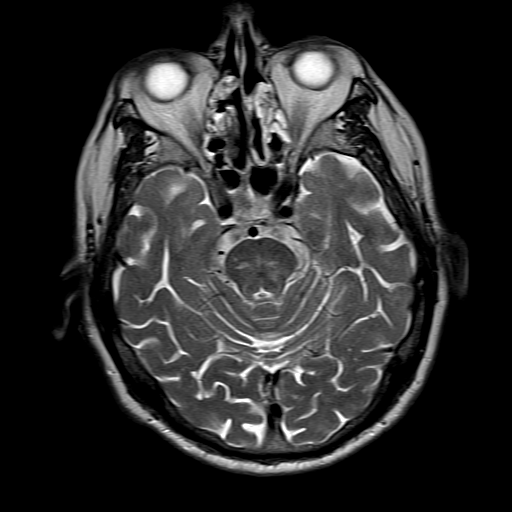
[im 17/25]
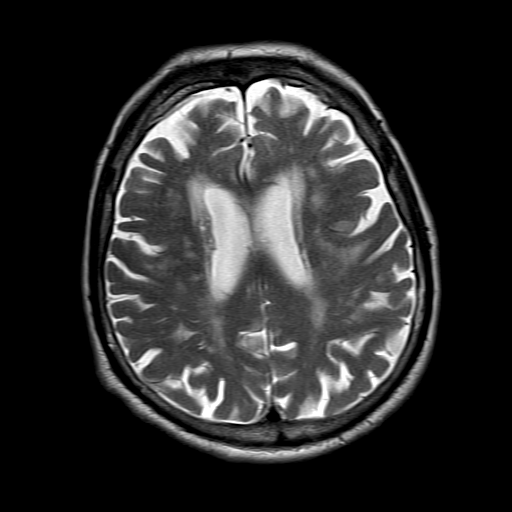
[im 25/25]
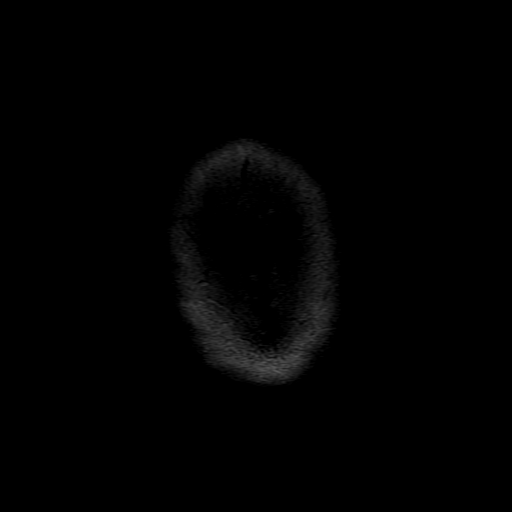

[Series 10: T1 · axial · 5.0mm · 0.69mm/px · z∈[-41,+102]mm · 4 of 25 slices shown]
[im 1/25]
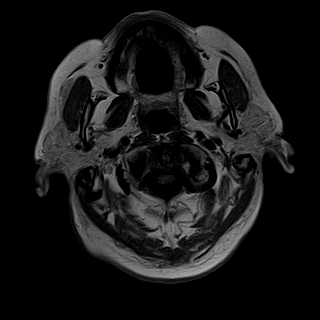
[im 9/25]
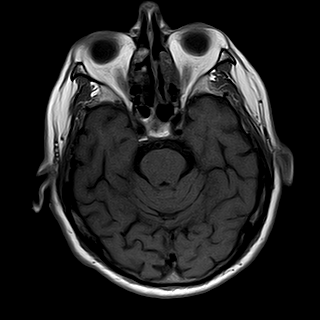
[im 17/25]
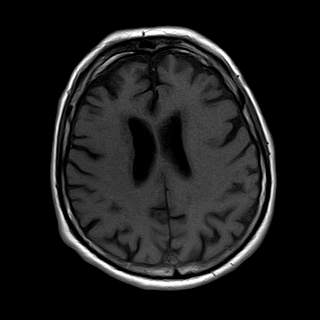
[im 25/25]
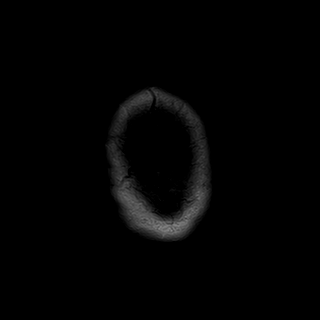

[Series 11: T2-star · axial · 5.0mm · 0.69mm/px · z∈[-41,+102]mm · 4 of 25 slices shown]
[im 1/25]
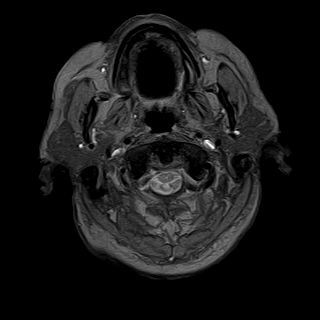
[im 9/25]
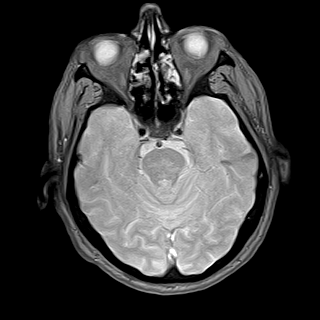
[im 17/25]
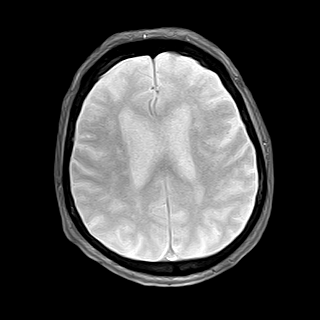
[im 25/25]
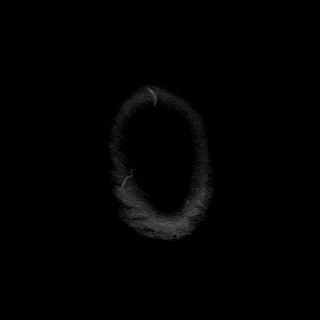

[Series 12: FLAIR · axial · 5.0mm · 0.76mm/px · z∈[-32,+93]mm · 4 of 22 slices shown (2 of 2)]
[im 1/22]
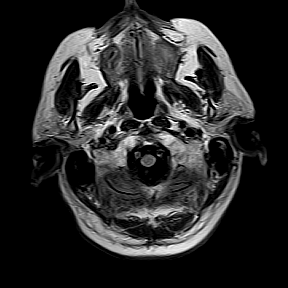
[im 8/22]
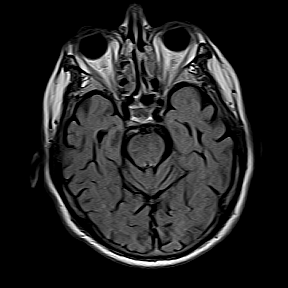
[im 15/22]
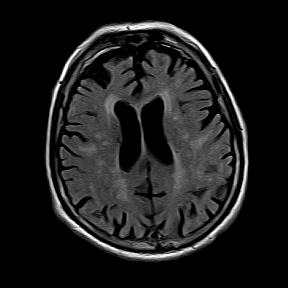
[im 22/22]
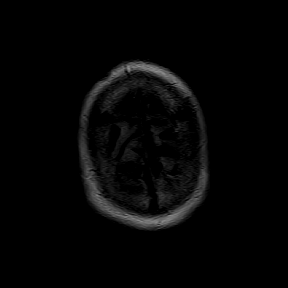

[Series 13: T2 · coronal · 6.0mm · 0.43mm/px · 4 of 24 slices shown (2 of 2)]
[im 1/24]
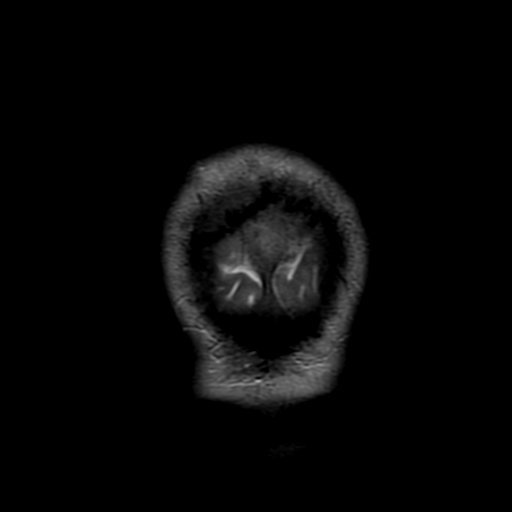
[im 8/24]
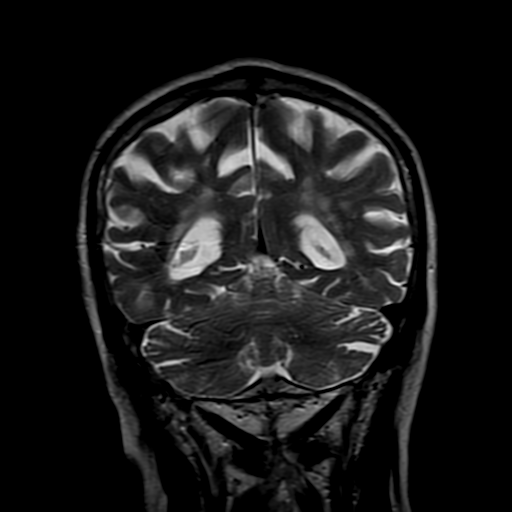
[im 16/24]
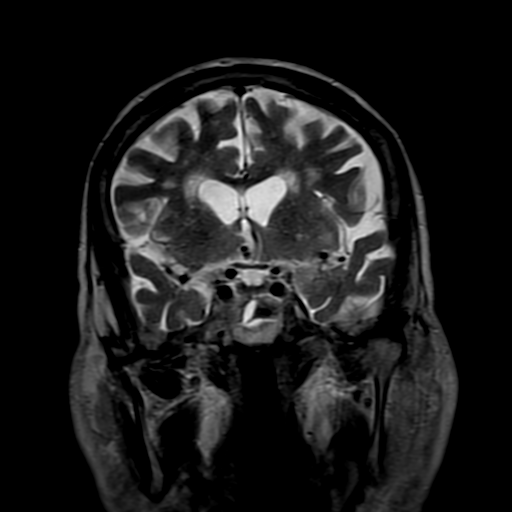
[im 24/24]
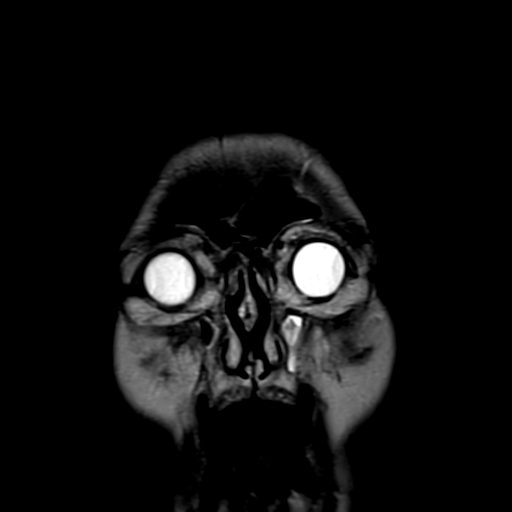

[48 of 48 positions shown; findings below may reference images not displayed]

FINDINGS: No evidence of acute ischemia on diffusion sequence.  No evidence of intracranial bleed or extra-axial collections.  Age-appropriate symmetric cerebral and cerebellar atrophy. Major arteries of the circle of Willis and dural venous sinuses are patent.  Extensive chronic small-vessel ischemic change of periventricular and subcortical white matter.  No abnormalities of the posterior fossa. 

Significant inflammatory changes of maxillary sinuses, ethmoid sinuses and sphenoid sinus due to sinusitis.  T2 bright lesion extending from the left maxillary sinus into the left nasal cavity measuring 2 cm in diameter may represent inflammatory polyp.  Mastoids are clear.  Pituitary gland shows no focal abnormalities.
IMPRESSION: 1. No evidence of acute ischemia.  Extensive chronic small-vessel ischemic change of periventricular white matter.  

2. Age-appropriate cerebral and cerebellar atrophy.  Major vascular structures of circle of Willis are patent. 

3. Extensive inflammatory changes of maxillary sinuses, left more than the right.  Possible 2 cm polyp extending from left maxillary sinus into the nasal cavity. Bilateral ethmoid sinusitis.

## 2022-07-04 ENCOUNTER — Other Ambulatory Visit: Payer: Self-pay

## 2022-07-04 ENCOUNTER — Telehealth (INDEPENDENT_AMBULATORY_CARE_PROVIDER_SITE_OTHER): Payer: Self-pay | Admitting: OTOLARYNGOLOGY

## 2022-07-04 ENCOUNTER — Ambulatory Visit (INDEPENDENT_AMBULATORY_CARE_PROVIDER_SITE_OTHER): Payer: Medicare Other | Admitting: OTOLARYNGOLOGY

## 2022-07-04 ENCOUNTER — Encounter (INDEPENDENT_AMBULATORY_CARE_PROVIDER_SITE_OTHER): Payer: Self-pay | Admitting: OTOLARYNGOLOGY

## 2022-07-04 VITALS — Ht 72.0 in | Wt 205.0 lb

## 2022-07-04 DIAGNOSIS — J329 Chronic sinusitis, unspecified: Secondary | ICD-10-CM

## 2022-07-04 DIAGNOSIS — H9319 Tinnitus, unspecified ear: Secondary | ICD-10-CM | POA: Insufficient documentation

## 2022-07-04 DIAGNOSIS — J342 Deviated nasal septum: Secondary | ICD-10-CM

## 2022-07-04 DIAGNOSIS — J331 Polypoid sinus degeneration: Secondary | ICD-10-CM

## 2022-07-04 DIAGNOSIS — J019 Acute sinusitis, unspecified: Secondary | ICD-10-CM

## 2022-07-04 DIAGNOSIS — J339 Nasal polyp, unspecified: Secondary | ICD-10-CM

## 2022-07-04 MED ORDER — AMOXICILLIN 875 MG-POTASSIUM CLAVULANATE 125 MG TABLET
1.0000 | ORAL_TABLET | Freq: Two times a day (BID) | ORAL | 0 refills | Status: DC
Start: 2022-07-04 — End: 2022-08-08

## 2022-07-04 NOTE — Procedures (Signed)
ENT, [place]  [address]  [place] [address]    Procedure Note    Name: Kurt Hayes MRN:  [id number]   Date: 07/04/2022 Age: 89 y.o.  DOB:   [birth year]       31231 - NASAL ENDOSCOPY DIAGNOSTIC UNILATERAL OR BILATERAL (AMB ONLY)    Performed by: Dia Sitter, DO  Authorized by: Dia Sitter, DO    Time Out:     Immediately before the procedure, a time out was called:  Yes    Patient verified:  Yes    Procedure Verified:  Yes    Site Verified:  Yes  Documentation:      Indications for procedure: Obstructive nasal breathing    Anesthesia: Oxymetazoline nasal spray    Description: Nasal endoscopy with rigid scope was performed with examination of the  septum, inferior, middle, and superior meatus, turbinates, sphenoethmoidal recess, and nasopharynx.     There were no granulation tissue noted.  ET orifices and nasopharynx were normal.     Findings: mucopus and polyps left worse than right.    The patient tolerated the procedure well.               Dia Sitter, DO

## 2022-07-04 NOTE — H&P (Signed)
ENT, [place]  [place] [address]    History and Physical     Name: Kurt Hayes MRN:  [id number]   Date: 07/04/2022 Age: 89 y.o.          New Patient      Chief Complaint:    Chief Complaint   Patient presents with    Sinus Problem     [facility] nasal [facility]. States having nasal congestion. States having history of nasal polypectomy done 3 times.       HPI:  Kurt Hayes is a 89 y.o. male presenting for a new patient visit. Patient states that he has a history of nasal polyps and has had several surgeries.  Patient states he does get nasal congestion and drainage.  Patient had MRI showing polypoid disease.       Past Medical History:   Diagnosis Date    Asthma     Esophageal reflux     Essential hypertension     Hearing loss                Family Medical History:    None               Medications:  Current Outpatient Medications   Medication Sig    allopurinoL (ZYLOPRIM) 100 mg Oral Tablet     amoxicillin-pot clavulanate (AUGMENTIN) 875-125 mg Oral Tablet Take 1 Tablet by mouth Every 12 hours    biotin 5 mg Oral Capsule 1 cap(s) orally once a day    BREO ELLIPTA 100-25 mcg/dose Inhalation Disk with Device Take 1 Inhalation by inhalation Once a day    bumetanide (BUMEX) 1 mg Oral Tablet     clopidogreL (PLAVIX) 75 mg Oral Tablet Take 1 tablet every day by oral route.    Coenzyme Q10-Vitamin E 100-5 mg-unit Oral Capsule Take 1 Capsule by mouth Once a day    finasteride (PROSCAR) 5 mg Oral Tablet     guaifenesin (MUCINEX) 1,200 mg Oral Tablet Extended Release 12hr Take by oral route.    lamoTRIgine (LAMICTAL) 25 mg Oral Tablet 2 tabs in the morning and 1tab in the evening orally 2 times a day    metoprolol succinate (TOPROL-XL) 50 mg Oral Tablet Sustained Release 24 hr Take 0.5 Tablets (25 mg total) by mouth Twice daily    nitroGLYCERIN (NITROSTAT) 0.4 mg Sublingual Tablet, Sublingual 1 tab sublingually every 5 minutes    pantoprazole (PROTONIX) 40  mg Oral Tablet, Delayed Release (E.C.)     rosuvastatin (CRESTOR) 20 mg Oral Tablet Take 1 tablet every day by oral route.    sodium bicarbonate 650 mg Oral Tablet TAKE 1 TABLET BY MOUTH IN THE MORNING AND 1 TABLET AT NOON AND 1 TABLET IN THE EVENING AND 1 TABLET BEFORE BEDTIME    telmisartan (MICARDIS) 80 mg Oral Tablet     venlafaxine (EFFEXOR XR) 37.5 mg Oral Capsule, Sust. Release 24 hr 1 capsule with food Orally Once a day    vit C/E/Zn/coppr/lutein/zeaxan (OCUVITE LUTEIN AND ZEAXANTHIN ORAL)     vits A,C,E/lutein/minerals (OCUVITE WITH LUTEIN ORAL) Take by mouth       Allergies:  Allergies   Allergen Reactions    Aspirin  Other Adverse Reaction (Add comment) and Shortness of Breath     Other Reaction(s): asthma attack    Asthma    "asthma"    "asthma"   Asthma    Prednisone  Other Adverse Reaction (Add comment)     Other Reaction(s): "don't want to take"    Not allergic does not prefer to take it       Review of Systems:  Review of Systems     Physical Exam:  Vitals:    07/04/22 0909   Weight: 93 kg (205 lb)   Height: 1.829 m (6')   BMI: 27.86      ENT Physical Exam  Constitutional  Appearance: patient appears well-developed, well-nourished and well-groomed,  Communication/Voice: communication appropriate for developmental age; vocal quality normal;  Head and Face  Appearance: head appears normal, face appears normal and face appears atraumatic;  Palpation: facial palpation normal;  Salivary: glands normal;  Ear  Hearing: intact;  Auricles: right auricle normal; left auricle normal;  External Mastoids: right external mastoid normal; left external mastoid normal;  Ear Canals: right ear canal normal; left ear canal normal;  Tympanic Membranes: right tympanic membrane normal; left tympanic membrane normal;  Nose  External Nose: nares patent bilaterally; external nose normal;  Internal Nose: nasal mucosa normal; bilateral inferior turbinates normal;  Nose comments: Perforation of nasal septum  Oral  Cavity/Oropharynx  Lips: normal;  Teeth: normal;  Gums: gingiva normal;  Tongue: normal;  Oral mucosa: normal;  Hard palate: normal;  Soft palate: normal;  Tonsils: normal;  Base of Tongue: normal;  Posterior pharyngeal wall: normal;  Neck  Neck: neck normal; neck palpation normal;  Thyroid: thyroid normal;  Respiratory  Inspection: breathing unlabored; normal breathing rate;  Lymphatic  Palpation: lymph nodes normal;  Neurovestibular  Mental Status: alert and oriented;  Psychiatric: mood normal; affect is appropriate;  Cranial Nerves: cranial nerves intact;       Assessment and Plan:    ICD-10-CM    1. Nasal polyposis  J33.9 CT SINUSES WO IV CONTRAST      2. DNS (deviated nasal septum)  J34.2       3. Chronic sinusitis, unspecified location  J32.9 31231 - NASAL ENDOSCOPY DIAGNOSTIC UNILATERAL OR BILATERAL (AMB ONLY)      4. Acute sinusitis, recurrence not specified, unspecified location  J01.90         Orders Placed This Encounter    31231 - NASAL ENDOSCOPY DIAGNOSTIC UNILATERAL OR BILATERAL (AMB ONLY)    CT SINUSES WO IV CONTRAST    amoxicillin-pot clavulanate (AUGMENTIN) 875-125 mg Oral Tablet    Will give a course of Augmentin  Will check CT of the sinuses  Will consider nucala    Follow Up:  Return for Follow up aqfter CT.     Dia Sitter, DO

## 2022-07-25 IMAGING — CT CT SINUS W/CORONALS
3 series · 16 of 47 positions shown, 19 images · non-contrast
Comparison: MRI brain dated 05/29/2022.

﻿EXAM:  CT SINUS W/CORONALS
INDICATION: History of chronic sinusitis.
TECHNIQUE: CT was performed through sinuses without contrast and reviewed in multiple projections. Radiation dose 538 mGy cm.  Images were reviewed in multiple windows and projections. Exam was performed using 1 or more of the following dose reduction techniques: Automated exposure control, adjustment of the mA and/or kV according to patient size, or the use of iterative reconstruction technique.

[Series 4: soft tissue · axial · 0.39mm/px · z∈[+134,+238]mm · 10 of 41 slices shown, 13 images]
[im 3/41  brain]
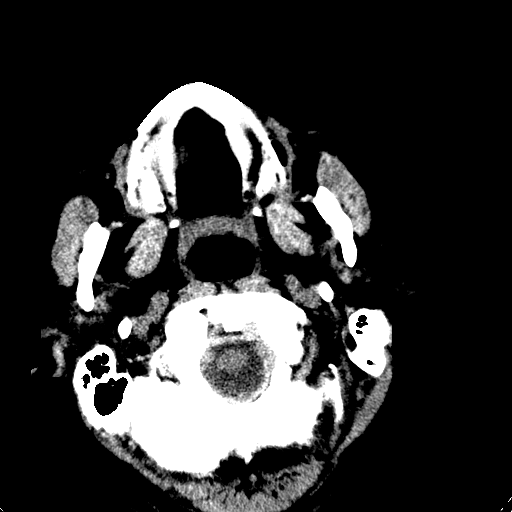
[im 3/41  bone]
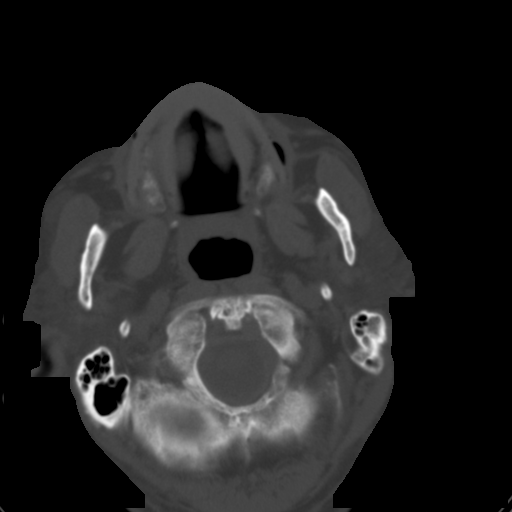
[im 7/41  bone]
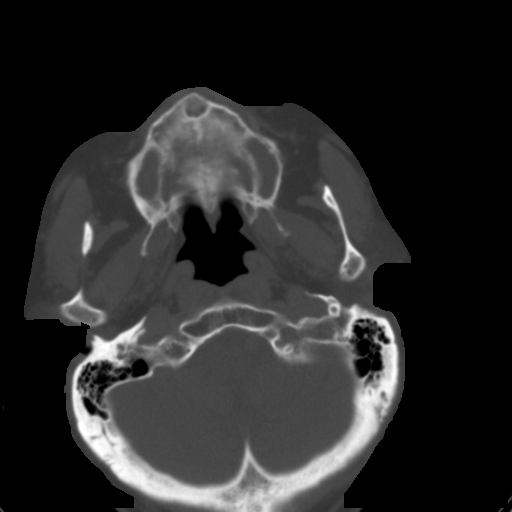
[im 12/41  bone]
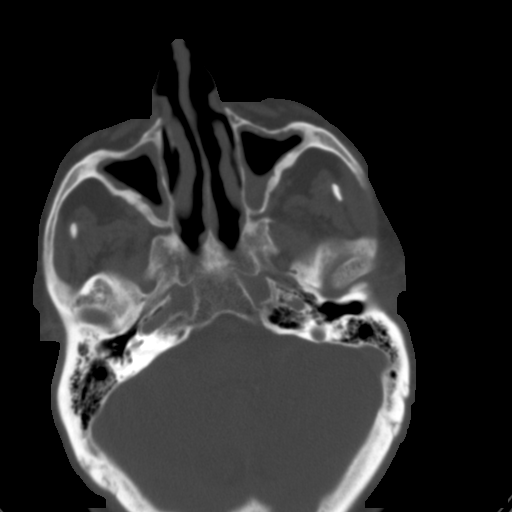
[im 14/41  bone]
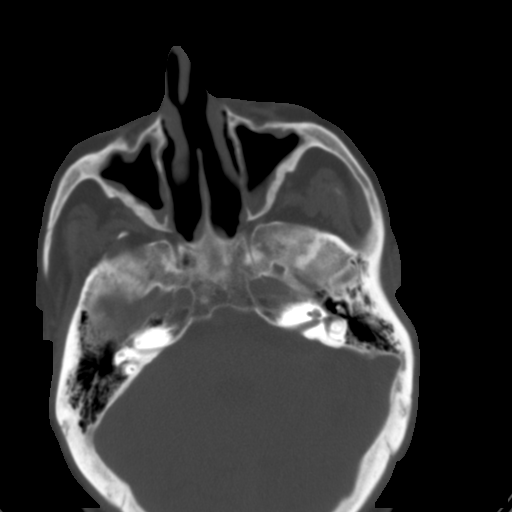
[im 18/41  brain]
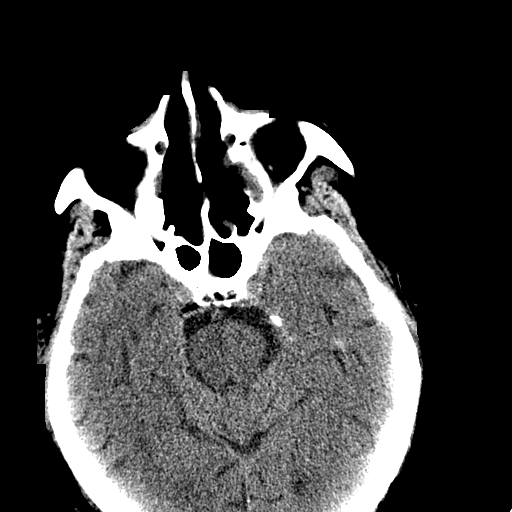
[im 18/41  bone]
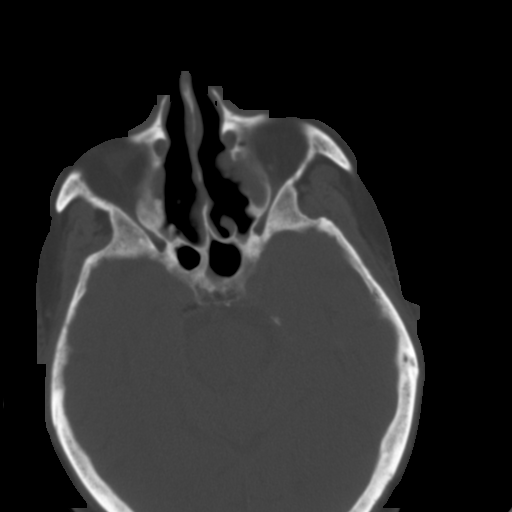
[im 23/41  bone]
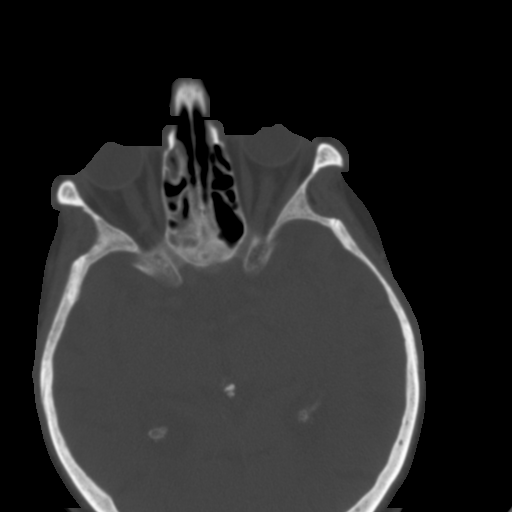
[im 27/41  bone]
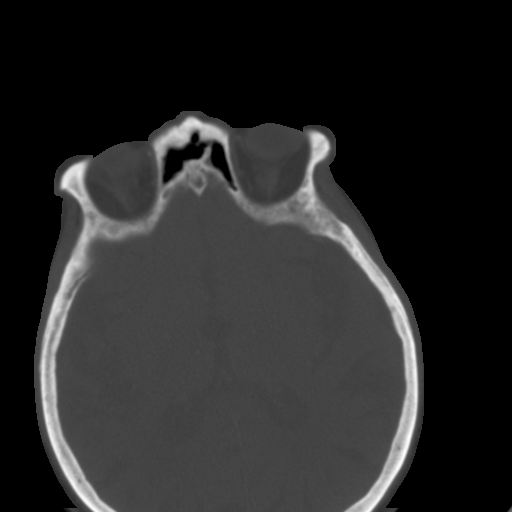
[im 31/41  bone]
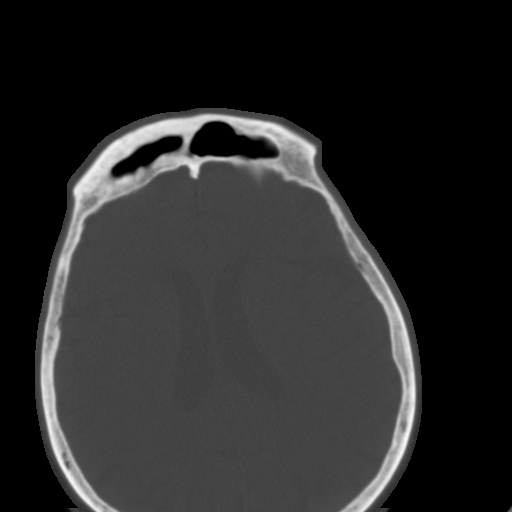
[im 34/41  brain]
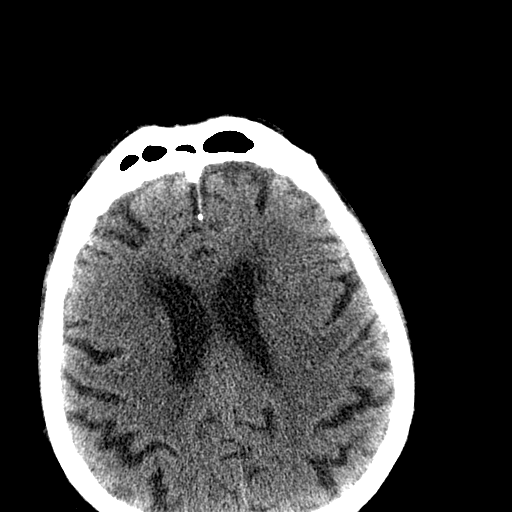
[im 34/41  bone]
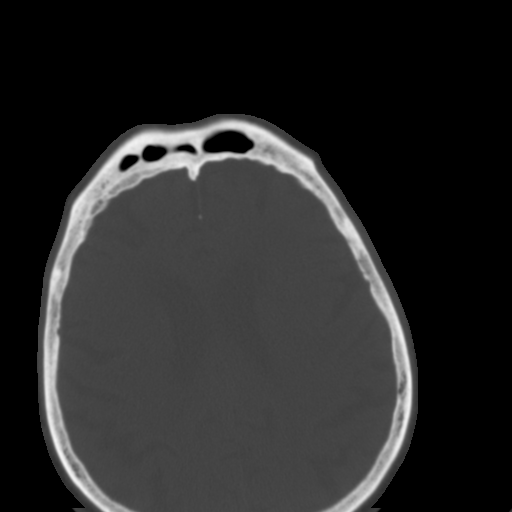
[im 38/41  bone]
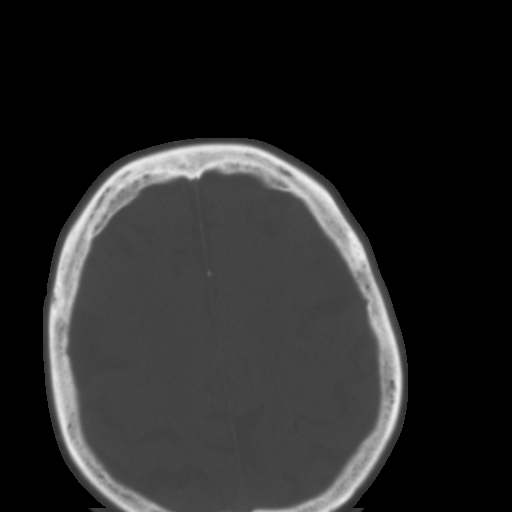

[cor · coronal · 0.39mm/px · 3 of 113 slices shown]
[im 38/113  bone]
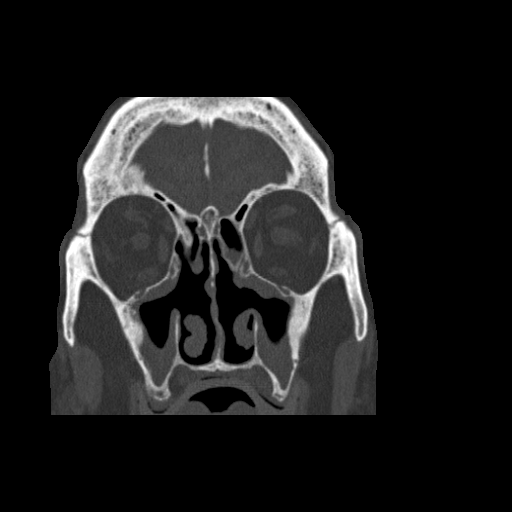
[im 50/113  bone]
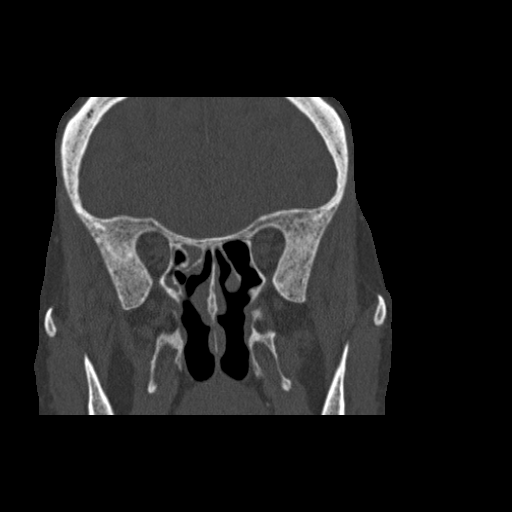
[im 63/113  bone]
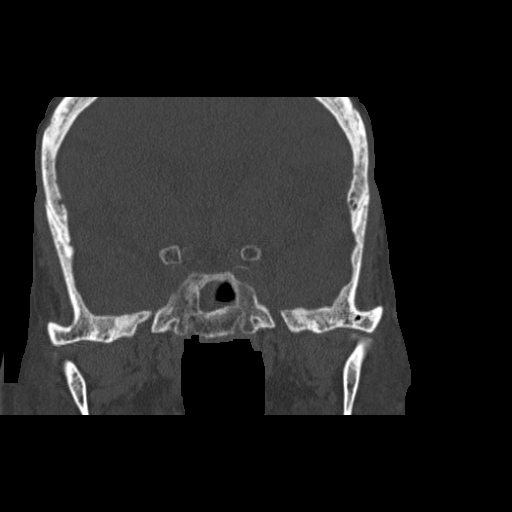

[sag · sagittal · 0.39mm/px · 3 of 100 slices shown]
[im 34/100  bone]
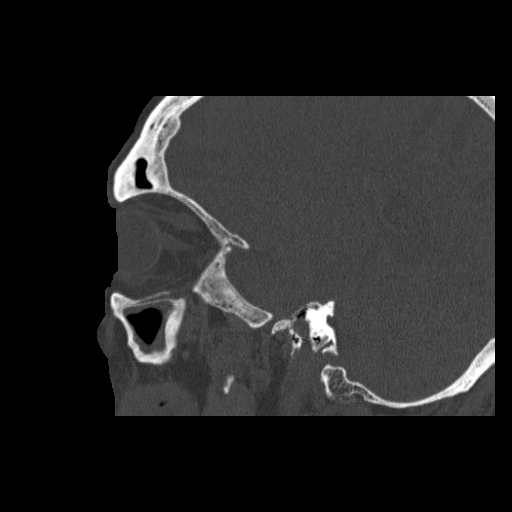
[im 50/100  bone]
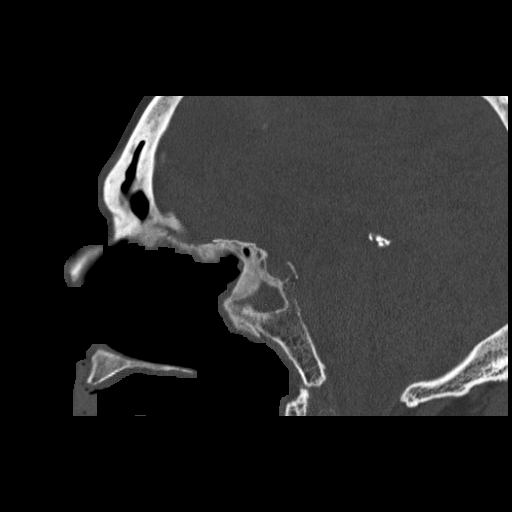
[im 67/100  bone]
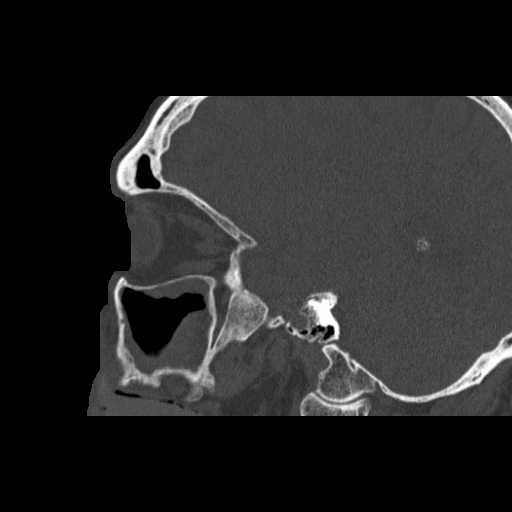

[16 of 47 positions shown; findings below may reference images not displayed]

FINDINGS: The mastoids and middle ear cavities are clear. 

Bony defect of the medial wall of the maxillary sinuses on both sides suggests possible postsurgical changes.

Mucoperiosteal inflammation of maxillary sinuses on both sides and ethmoid sinuses on both sides.  Multiple inflammatory polyps are noted in the nasal cavity including 1.1 cm size polyp over the nasal septum on the right side.  Large defect of the nasal septum is noted in the anterior aspect.

Frontal sinuses are clear.  Moderate inflammatory changes of sphenoid sinus.
IMPRESSION: 1. Large bony defect of the nasal septum.  Please correlate with prior history to rule out surgical defect versus defect from destructive chemical.

2. Possible postsurgical changes of medial wall of both maxillary sinuses.  Please correlate with prior surgical history. 

3. Extensive mucosal inflammation of maxillary sinuses, ethmoid sinuses and sphenoid sinus are noted. Multiple inflammatory polyps of nasal cavity are noted.  

Electronically Signed by JIM, KAKI at [id number] [DATE]

## 2022-08-08 ENCOUNTER — Ambulatory Visit (INDEPENDENT_AMBULATORY_CARE_PROVIDER_SITE_OTHER): Payer: Medicare Other | Admitting: OTOLARYNGOLOGY

## 2022-08-08 ENCOUNTER — Encounter (INDEPENDENT_AMBULATORY_CARE_PROVIDER_SITE_OTHER): Payer: Self-pay | Admitting: OTOLARYNGOLOGY

## 2022-08-08 ENCOUNTER — Other Ambulatory Visit: Payer: Self-pay

## 2022-08-08 VITALS — Ht 72.0 in | Wt 205.0 lb

## 2022-08-08 DIAGNOSIS — J339 Nasal polyp, unspecified: Secondary | ICD-10-CM

## 2022-08-08 DIAGNOSIS — J342 Deviated nasal septum: Secondary | ICD-10-CM

## 2022-08-08 DIAGNOSIS — J329 Chronic sinusitis, unspecified: Secondary | ICD-10-CM

## 2022-08-08 NOTE — H&P (Signed)
[facility]  [facility], [place]    Progress Note    Name: Kurt Hayes MRN:  [id number]   Date: 08/08/2022 Age: 89 y.o.          Follow Up      Subjective:   Chief Complaint:   Follow-up After Testing (Here to review sinus CT results. No c/o noted)       History of Present Illness:  Kurt Hayes is a 89 y.o. old male who presents to the clinic for follow-up.  Patient states he is filling much better since taking Augmentin.  He is breathing much better and did have CT of the sinuses.           Review of Systems     Physical Exam:     Vitals:    08/08/22 1351   Weight: 93 kg (205 lb)   Height: 1.829 m (6')   BMI: 27.86      ENT Physical Exam  Constitutional  Appearance: patient appears well-developed, well-nourished and well-groomed,  Communication/Voice: communication appropriate for developmental age; vocal quality normal;  Head and Face  Appearance: head appears normal, face appears normal and face appears atraumatic;  Palpation: facial palpation normal;  Salivary: glands normal;  Ear  Hearing: intact;  Auricles: right auricle normal; left auricle normal;  External Mastoids: right external mastoid normal; left external mastoid normal;  Ear Canals: right ear canal normal; left ear canal normal;  Tympanic Membranes: right tympanic membrane normal; left tympanic membrane normal;  Nose  External Nose: nares patent bilaterally; external nose normal;  Internal Nose: nasal mucosa normal; bilateral inferior turbinates normal;  Nose comments: Perforation of nasal septum  Oral Cavity/Oropharynx  Lips: normal;  Teeth: normal;  Gums: gingiva normal;  Tongue: normal;  Oral mucosa: normal;  Hard palate: normal;  Soft palate: normal;  Tonsils: normal;  Base of Tongue: normal;  Posterior pharyngeal wall: normal;  Neck  Neck: neck normal; neck palpation normal;  Thyroid: thyroid normal;  Respiratory  Inspection: breathing unlabored; normal breathing rate;  Lymphatic  Palpation: lymph nodes normal;  Neurovestibular  Mental Status:  alert and oriented;  Psychiatric: mood normal; affect is appropriate;  Cranial Nerves: cranial nerves intact;       Assessment and Plan:       ICD-10-CM    1. Nasal polyposis  J33.9       2. DNS (deviated nasal septum)  J34.2       3. Chronic sinusitis, unspecified location  J32.9         CT reviewed with the patient and he wishes to continue to monitor.  I will start xhance.       Follow up:  Return in about 3 months (around 11/06/2022).    Dia Sitter, DO

## 2022-08-08 NOTE — Addendum Note (Signed)
Addended by: Leeroy Cha on: 08/08/2022 02:16 PM     Modules accepted: Orders

## 2022-10-08 ENCOUNTER — Other Ambulatory Visit (INDEPENDENT_AMBULATORY_CARE_PROVIDER_SITE_OTHER): Payer: Self-pay | Admitting: OTOLARYNGOLOGY

## 2022-11-07 ENCOUNTER — Ambulatory Visit (INDEPENDENT_AMBULATORY_CARE_PROVIDER_SITE_OTHER): Payer: Medicare Other | Admitting: OTOLARYNGOLOGY

## 2022-11-07 ENCOUNTER — Encounter (INDEPENDENT_AMBULATORY_CARE_PROVIDER_SITE_OTHER): Payer: Self-pay | Admitting: OTOLARYNGOLOGY

## 2022-11-07 ENCOUNTER — Other Ambulatory Visit: Payer: Self-pay

## 2022-11-07 VITALS — Ht 72.0 in | Wt 205.0 lb

## 2022-11-07 DIAGNOSIS — J339 Nasal polyp, unspecified: Secondary | ICD-10-CM

## 2022-11-07 DIAGNOSIS — J342 Deviated nasal septum: Secondary | ICD-10-CM

## 2022-11-07 DIAGNOSIS — J329 Chronic sinusitis, unspecified: Secondary | ICD-10-CM

## 2022-11-07 NOTE — Procedures (Signed)
ENT, [facility]  [address]  [place] [address]    Procedure Note    Name: Kurt Hayes MRN:  [id number]   Date: 11/07/2022 DOB:  11-14-1929 (89 y.o.)         31231 - NASAL ENDOSCOPY DIAGNOSTIC UNILATERAL OR BILATERAL (AMB ONLY)    Performed by: Conchita Paris, DO  Authorized by: Conchita Paris, DO    Time Out:     Immediately before the procedure, a time out was called:  Yes    Patient verified:  Yes    Procedure Verified:  Yes    Site Verified:  Yes  Documentation:      ENT, [facility]  [address] [place] [address]    Procedure Note    Name: Kurt Hayes MRN:  [id number]  Date: 11/07/2022 DOB:  1929-08-26 (89 y.o.)        @PROCDOC @    Indications for procedure: Monitor chronic disease    Anesthesia: Oxymetazoline nasal spray    Description: Nasal endoscopy with rigid scope was performed with examination of the  septum, inferior, middle, and superior meatus, turbinates, sphenoethmoidal recess, and nasopharynx.     There were no polyps, pus, or granulation tissue noted.  ET orifices and nasopharynx were normal.     Findings: Nasal polyps, noon obstructing     The patient tolerated the procedure well.    Conchita Paris, DO             Adriella Essex [place], DO

## 2022-11-07 NOTE — H&P (Signed)
ENT, [facility]  [address]  [place] [address]    Progress Note    Name: Kurt Hayes MRN:  [id number]   Date: 11/07/2022 DOB:  Jul 19, 1929 (89 y.o.)              Follow Up      Subjective:   Chief Complaint:   Follow Up 3 Months (3 month rc on sinuses. Not currently using nasal sprays)       History of Present Illness:  Kurt Hayes is a 89 y.o. old male who presents to the clinic for follow-up. Patient states thinks that sinuses are feeling better. He was not able to get xhance.  He denies any fever/chills, facial pressure or drainage.     Review of Systems     Physical Exam:     Vitals:    11/07/22 1139   Weight: 93 kg (205 lb)   Height: 1.829 m (6')   BMI: 27.86      ENT Physical Exam  Constitutional  Appearance: patient appears well-developed, well-nourished and well-groomed,  Communication/Voice: communication appropriate for developmental age; vocal quality normal;  Head and Face  Appearance: head appears normal, face appears normal and face appears atraumatic;  Palpation: facial palpation normal;  Salivary: glands normal;  Ear  Hearing: intact;  Auricles: right auricle normal; left auricle normal;  External Mastoids: right external mastoid normal; left external mastoid normal;  Ear Canals: right ear canal normal; left ear canal normal;  Tympanic Membranes: right tympanic membrane normal; left tympanic membrane normal;  Nose  External Nose: nares patent bilaterally; external nose normal;  Internal Nose: nasal mucosa normal; septum normal; bilateral inferior turbinates normal;  Nose comments: Perforation of nasal septum  Oral Cavity/Oropharynx  Lips: normal;  Teeth: normal;  Gums: gingiva normal;  Tongue: normal;  Oral mucosa: normal;  Hard palate: normal;  Soft palate: normal;  Tonsils: normal;  Base of Tongue: normal;  Posterior pharyngeal wall: normal;  Neck  Neck: neck normal; neck palpation normal;  Thyroid: thyroid normal;  Respiratory  Inspection: breathing unlabored; normal breathing  rate;  Lymphatic  Palpation: lymph nodes normal;  Neurovestibular  Mental Status: alert and oriented;  Psychiatric: mood normal; affect is appropriate;  Cranial Nerves: cranial nerves intact;       Assessment and Plan:       ICD-10-CM    1. DNS (deviated nasal septum)  J34.2       2. Chronic sinusitis, unspecified location  J32.9 31231 - NASAL ENDOSCOPY DIAGNOSTIC UNILATERAL OR BILATERAL (AMB ONLY)      3. Nasal polyposis  J33.9         Orders Placed This Encounter    32440 - NASAL ENDOSCOPY DIAGNOSTIC UNILATERAL OR BILATERAL (AMB ONLY)   Will start nucala or dupixent depending on insurance. Patient will think about budesonide irrigations.       Follow up:  Return in about 3 months (around 02/07/2023).    Conchita Paris, DO

## 2022-11-21 ENCOUNTER — Other Ambulatory Visit (HOSPITAL_COMMUNITY)
Admit: 2022-11-21 | Discharge: 2022-11-21 | Disposition: A | Discharge status: Home / Self Care | Payer: Self-pay | Admitting: PHYSICAL MEDICINE AND REHABILITATION

## 2022-11-22 ENCOUNTER — Other Ambulatory Visit (HOSPITAL_COMMUNITY): Payer: Self-pay | Admitting: PHYSICAL MEDICINE AND REHABILITATION

## 2022-11-22 LAB — COMPREHENSIVE METABOLIC PANEL, NON-FASTING
ALBUMIN/GLOBULIN RATIO: 1.6 — ABNORMAL HIGH (ref 0.8–1.4)
ALBUMIN: 3.9 g/dL (ref 3.5–5.7)
ALKALINE PHOSPHATASE: 114 U/L — ABNORMAL HIGH (ref 34–104)
ALT (SGPT): 20 U/L (ref 7–52)
ANION GAP: 8 mmol/L (ref 4–13)
AST (SGOT): 17 U/L (ref 13–39)
BILIRUBIN TOTAL: 0.8 mg/dL (ref 0.3–1.2)
BUN/CREA RATIO: 14 (ref 6–22)
BUN: 22 mg/dL (ref 7–25)
CALCIUM, CORRECTED: 10.4 mg/dL (ref 8.9–10.8)
CALCIUM: 10.3 mg/dL (ref 8.6–10.3)
CHLORIDE: 109 mmol/L — ABNORMAL HIGH (ref 98–107)
CO2 TOTAL: 26 mmol/L (ref 21–31)
CREATININE: 1.57 mg/dL — ABNORMAL HIGH (ref 0.60–1.30)
ESTIMATED GFR: 41 mL/min/{1.73_m2} — ABNORMAL LOW (ref >59)
GLOBULIN: 2.4 — ABNORMAL LOW (ref 2.9–5.4)
GLUCOSE: 98 mg/dL (ref 74–109)
OSMOLALITY, CALCULATED: 288 mOsm/kg (ref 270–290)
POTASSIUM: 3 mmol/L — ABNORMAL LOW (ref 3.5–5.1)
PROTEIN TOTAL: 6.3 g/dL — ABNORMAL LOW (ref 6.4–8.9)
SODIUM: 143 mmol/L (ref 136–145)

## 2022-11-22 LAB — CBC
HCT: 35.8 % — ABNORMAL LOW (ref 36.7–47.1)
HGB: 12.3 g/dL — ABNORMAL LOW (ref 12.5–16.3)
MCH: 32.4 pg (ref 23.8–33.4)
MCHC: 34.3 g/dL (ref 32.5–36.3)
MCV: 94.2 fL (ref 73.0–96.2)
MPV: 8.6 fL (ref 7.4–11.4)
PLATELETS: 140 10*3/uL (ref 140–440)
RBC: 3.8 10*6/uL — ABNORMAL LOW (ref 4.06–5.63)
RDW: 14.4 % (ref 12.1–16.2)
WBC: 6.3 10*3/uL (ref 3.6–10.2)

## 2022-11-23 ENCOUNTER — Other Ambulatory Visit (HOSPITAL_COMMUNITY): Payer: Self-pay | Admitting: PHYSICAL MEDICINE AND REHABILITATION

## 2022-11-23 LAB — URINALYSIS, MACROSCOPIC
BILIRUBIN: NEGATIVE mg/dL
BLOOD: NEGATIVE mg/dL
GLUCOSE: NEGATIVE mg/dL
KETONES: NEGATIVE mg/dL
LEUKOCYTES: NEGATIVE WBCs/uL
NITRITE: NEGATIVE
PH: 6 (ref 5.0–9.0)
PROTEIN: 30 mg/dL — AB
SPECIFIC GRAVITY: 1.022 (ref 1.002–1.030)
UROBILINOGEN: NORMAL mg/dL

## 2022-11-23 LAB — URINALYSIS, MACROSCOPIC AND MICROSCOPIC W/CULTURE REFLEX - CLIENT CONSOLIDATED
BILIRUBIN: NEGATIVE mg/dL
BLOOD: NEGATIVE mg/dL
GLUCOSE: NEGATIVE mg/dL
KETONES: NEGATIVE mg/dL
LEUKOCYTES: NEGATIVE WBCs/uL
NITRITE: NEGATIVE
PH: 6 (ref 5.0–9.0)
PROTEIN: 30 mg/dL — AB
SPECIFIC GRAVITY: 1.022 (ref 1.002–1.030)
UROBILINOGEN: NORMAL mg/dL

## 2022-11-23 LAB — URINALYSIS, MICROSCOPIC
RBCS: 1 /hpf (ref <4)
SQUAMOUS EPITHELIAL: 1 /hpf (ref <28)
WBCS: <1 /hpf (ref <6)

## 2022-11-25 ENCOUNTER — Other Ambulatory Visit (HOSPITAL_COMMUNITY): Payer: Self-pay | Admitting: PHYSICAL MEDICINE AND REHABILITATION

## 2022-11-25 ENCOUNTER — Ambulatory Visit
Admission: RE | Admit: 2022-11-25 | Discharge: 2022-11-25 | Disposition: A | Discharge status: Home / Self Care | Payer: Self-pay | Source: Ambulatory Visit | Attending: PHYSICAL MEDICINE AND REHABILITATION | Admitting: PHYSICAL MEDICINE AND REHABILITATION

## 2022-11-25 DIAGNOSIS — R0602 Shortness of breath: Secondary | ICD-10-CM

## 2022-11-25 DIAGNOSIS — W19XXXD Unspecified fall, subsequent encounter: Secondary | ICD-10-CM

## 2022-11-25 LAB — COVID-19, FLU A/B, RSV RAPID BY PCR - LAB USE ONLY
INFLUENZA VIRUS TYPE A: NOT DETECTED
INFLUENZA VIRUS TYPE B: NOT DETECTED
RESPIRATORY SYNCTIAL VIRUS (RSV): NOT DETECTED
SARS-CoV-2: NOT DETECTED

## 2022-11-26 ENCOUNTER — Other Ambulatory Visit (HOSPITAL_COMMUNITY): Payer: Self-pay | Admitting: PHYSICAL MEDICINE AND REHABILITATION

## 2022-11-26 LAB — POTASSIUM: POTASSIUM: 3.3 mmol/L — ABNORMAL LOW (ref 3.5–5.1)

## 2022-11-28 ENCOUNTER — Other Ambulatory Visit (HOSPITAL_COMMUNITY): Payer: Self-pay | Admitting: PHYSICAL MEDICINE AND REHABILITATION

## 2022-11-28 LAB — POTASSIUM: POTASSIUM: 3.7 mmol/L (ref 3.5–5.1)

## 2022-12-02 ENCOUNTER — Other Ambulatory Visit (HOSPITAL_COMMUNITY): Payer: Self-pay | Admitting: PHYSICAL MEDICINE AND REHABILITATION

## 2022-12-02 ENCOUNTER — Inpatient Hospital Stay
Admission: RE | Admit: 2022-12-02 | Discharge: 2022-12-02 | Disposition: A | Discharge status: Home / Self Care | Payer: Medicare Other | Source: Ambulatory Visit | Attending: PHYSICAL MEDICINE AND REHABILITATION | Admitting: PHYSICAL MEDICINE AND REHABILITATION

## 2022-12-02 DIAGNOSIS — R051 Acute cough: Secondary | ICD-10-CM

## 2022-12-02 DIAGNOSIS — R69 Illness, unspecified: Secondary | ICD-10-CM

## 2022-12-02 LAB — URINALYSIS, MACROSCOPIC
BILIRUBIN: NEGATIVE mg/dL
BLOOD: NEGATIVE mg/dL
GLUCOSE: NEGATIVE mg/dL
KETONES: NEGATIVE mg/dL
LEUKOCYTES: NEGATIVE WBCs/uL
NITRITE: NEGATIVE
PH: 5 (ref 5.0–9.0)
PROTEIN: 30 mg/dL — AB
SPECIFIC GRAVITY: 1.016 (ref 1.002–1.030)
UROBILINOGEN: NORMAL mg/dL

## 2022-12-02 LAB — CBC WITH DIFF
BASOPHIL #: 0.1 10*3/uL (ref 0.00–0.10)
BASOPHIL %: 1 % (ref 0–1)
EOSINOPHIL #: 0 10*3/uL (ref 0.00–0.50)
EOSINOPHIL %: 0 % — ABNORMAL LOW
HCT: 38.4 % (ref 36.7–47.1)
HGB: 12.7 g/dL (ref 12.5–16.3)
LYMPHOCYTE #: 1.1 10*3/uL (ref 1.00–3.00)
LYMPHOCYTE %: 5 % — ABNORMAL LOW (ref 16–44)
MCH: 31.8 pg (ref 23.8–33.4)
MCHC: 33.1 g/dL (ref 32.5–36.3)
MCV: 95.9 fL (ref 73.0–96.2)
MONOCYTE #: 1.2 10*3/uL — ABNORMAL HIGH (ref 0.30–1.00)
MONOCYTE %: 6 % (ref 5–13)
MPV: 9.2 fL (ref 7.4–11.4)
NEUTROPHIL #: 18.6 10*3/uL — ABNORMAL HIGH (ref 1.85–7.80)
NEUTROPHIL %: 89 % — ABNORMAL HIGH (ref 43–77)
PLATELETS: 157 10*3/uL (ref 140–440)
RBC: 4.01 10*6/uL — ABNORMAL LOW (ref 4.06–5.63)
RDW: 14.1 % (ref 12.1–16.2)
WBC: 21 10*3/uL — ABNORMAL HIGH (ref 3.6–10.2)

## 2022-12-02 LAB — URINALYSIS, MACROSCOPIC AND MICROSCOPIC W/CULTURE REFLEX - CLIENT CONSOLIDATED
BILIRUBIN: NEGATIVE mg/dL
BLOOD: NEGATIVE mg/dL
GLUCOSE: NEGATIVE mg/dL
KETONES: NEGATIVE mg/dL
LEUKOCYTES: NEGATIVE WBCs/uL
NITRITE: NEGATIVE
PH: 5 (ref 5.0–9.0)
PROTEIN: 30 mg/dL — AB
SPECIFIC GRAVITY: 1.016 (ref 1.002–1.030)
UROBILINOGEN: NORMAL mg/dL

## 2022-12-02 LAB — CBC/DIFF - CLIENT CONSOLIDATED
BASOPHIL #: 0.1 10*3/uL (ref 0.00–0.10)
BASOPHIL %: 1 % (ref 0–1)
EOSINOPHIL #: 0 10*3/uL (ref 0.00–0.50)
EOSINOPHIL %: 0 % — ABNORMAL LOW
HCT: 38.4 % (ref 36.7–47.1)
HGB: 12.7 g/dL (ref 12.5–16.3)
LYMPHOCYTE #: 1.1 10*3/uL (ref 1.00–3.00)
LYMPHOCYTE %: 5 % — ABNORMAL LOW (ref 16–44)
MCH: 31.8 pg (ref 23.8–33.4)
MCHC: 33.1 g/dL (ref 32.5–36.3)
MCV: 95.9 fL (ref 73.0–96.2)
MONOCYTE #: 1.2 10*3/uL — ABNORMAL HIGH (ref 0.30–1.00)
MONOCYTE %: 6 % (ref 5–13)
MPV: 9.2 fL (ref 7.4–11.4)
NEUTROPHIL #: 18.6 10*3/uL — ABNORMAL HIGH (ref 1.85–7.80)
NEUTROPHIL %: 89 % — ABNORMAL HIGH (ref 43–77)
PLATELET MORPHOLOGY COMMENT: NORMAL
PLATELETS: 157 10*3/uL (ref 140–440)
RBC: 4.01 10*6/uL — ABNORMAL LOW (ref 4.06–5.63)
RDW: 14.1 % (ref 12.1–16.2)
WBC: 21 10*3/uL
WBC: 21 10*3/uL — ABNORMAL HIGH (ref 3.6–10.2)

## 2022-12-02 LAB — URINALYSIS, MICROSCOPIC
HYALINE CASTS: 15 /lpf — ABNORMAL HIGH (ref <0)
RBCS: 1 /hpf (ref <4)
SQUAMOUS EPITHELIAL: 1 /hpf (ref <28)
TRANSITIONAL EPITHELIAL CELLS URINE: <1 /hpf (ref <6)
WBCS: 1 /hpf (ref <6)

## 2022-12-02 LAB — SCAN DIFFERENTIAL: PLATELET MORPHOLOGY COMMENT: NORMAL

## 2022-12-03 ENCOUNTER — Other Ambulatory Visit (HOSPITAL_COMMUNITY): Payer: Self-pay | Admitting: PHYSICAL MEDICINE AND REHABILITATION

## 2022-12-03 DIAGNOSIS — R1310 Dysphagia, unspecified: Secondary | ICD-10-CM

## 2022-12-04 ENCOUNTER — Other Ambulatory Visit (HOSPITAL_COMMUNITY): Payer: Self-pay | Admitting: PHYSICAL MEDICINE AND REHABILITATION

## 2022-12-04 LAB — CBC WITH DIFF
BASOPHIL #: 0 10*3/uL (ref 0.00–0.10)
BASOPHIL %: 0 % (ref 0–1)
EOSINOPHIL #: 0.2 10*3/uL (ref 0.00–0.50)
EOSINOPHIL %: 1 %
HCT: 35.8 % — ABNORMAL LOW (ref 36.7–47.1)
HGB: 12.1 g/dL — ABNORMAL LOW (ref 12.5–16.3)
LYMPHOCYTE #: 1.1 10*3/uL (ref 1.00–3.00)
LYMPHOCYTE %: 9 % — ABNORMAL LOW (ref 16–44)
MCH: 31.9 pg (ref 23.8–33.4)
MCHC: 33.7 g/dL (ref 32.5–36.3)
MCV: 94.5 fL (ref 73.0–96.2)
MONOCYTE #: 0.9 10*3/uL (ref 0.30–1.00)
MONOCYTE %: 8 % (ref 5–13)
MPV: 8.1 fL (ref 7.4–11.4)
NEUTROPHIL #: 9.6 10*3/uL — ABNORMAL HIGH (ref 1.85–7.80)
NEUTROPHIL %: 82 % — ABNORMAL HIGH (ref 43–77)
PLATELETS: 230 10*3/uL (ref 140–440)
RBC: 3.78 10*6/uL — ABNORMAL LOW (ref 4.06–5.63)
RDW: 14.1 % (ref 12.1–16.2)
WBC: 11.7 10*3/uL — ABNORMAL HIGH (ref 3.6–10.2)

## 2022-12-04 LAB — CBC/DIFF - CLIENT CONSOLIDATED
BASOPHIL #: 0 10*3/uL (ref 0.00–0.10)
BASOPHIL %: 0 % (ref 0–1)
EOSINOPHIL #: 0.2 10*3/uL (ref 0.00–0.50)
EOSINOPHIL %: 1 %
HCT: 35.8 % — ABNORMAL LOW (ref 36.7–47.1)
HGB: 12.1 g/dL — ABNORMAL LOW (ref 12.5–16.3)
LYMPHOCYTE #: 1.1 10*3/uL (ref 1.00–3.00)
LYMPHOCYTE %: 9 % — ABNORMAL LOW (ref 16–44)
MCH: 31.9 pg (ref 23.8–33.4)
MCHC: 33.7 g/dL (ref 32.5–36.3)
MCV: 94.5 fL (ref 73.0–96.2)
MONOCYTE #: 0.9 10*3/uL (ref 0.30–1.00)
MONOCYTE %: 8 % (ref 5–13)
MPV: 8.1 fL (ref 7.4–11.4)
NEUTROPHIL #: 9.6 10*3/uL — ABNORMAL HIGH (ref 1.85–7.80)
NEUTROPHIL %: 82 % — ABNORMAL HIGH (ref 43–77)
PLATELETS: 230 10*3/uL (ref 140–440)
RBC: 3.78 10*6/uL — ABNORMAL LOW (ref 4.06–5.63)
RDW: 14.1 % (ref 12.1–16.2)
WBC: 11.7 10*3/uL
WBC: 11.7 10*3/uL — ABNORMAL HIGH (ref 3.6–10.2)

## 2022-12-04 LAB — BASIC METABOLIC PANEL, FASTING
ANION GAP: 10 mmol/L (ref 4–13)
BUN/CREA RATIO: 18 (ref 6–22)
BUN: 44 mg/dL — ABNORMAL HIGH (ref 7–25)
CALCIUM: 10.7 mg/dL — ABNORMAL HIGH (ref 8.6–10.3)
CHLORIDE: 109 mmol/L — ABNORMAL HIGH (ref 98–107)
CO2 TOTAL: 23 mmol/L (ref 21–31)
CREATININE: 2.49 mg/dL — ABNORMAL HIGH (ref 0.60–1.30)
ESTIMATED GFR: 24 mL/min/{1.73_m2} — ABNORMAL LOW (ref >59)
GLUCOSE: 92 mg/dL (ref 74–109)
OSMOLALITY, CALCULATED: 294 mOsm/kg — ABNORMAL HIGH (ref 270–290)
POTASSIUM: 3.6 mmol/L (ref 3.5–5.1)
SODIUM: 142 mmol/L (ref 136–145)

## 2022-12-05 ENCOUNTER — Other Ambulatory Visit (HOSPITAL_COMMUNITY): Payer: Self-pay | Admitting: PHYSICAL MEDICINE AND REHABILITATION

## 2022-12-05 LAB — BASIC METABOLIC PANEL, FASTING
ANION GAP: 11 mmol/L (ref 4–13)
BUN/CREA RATIO: 17 (ref 6–22)
BUN: 37 mg/dL — ABNORMAL HIGH (ref 7–25)
CALCIUM: 10.5 mg/dL — ABNORMAL HIGH (ref 8.6–10.3)
CHLORIDE: 110 mmol/L — ABNORMAL HIGH (ref 98–107)
CO2 TOTAL: 24 mmol/L (ref 21–31)
CREATININE: 2.13 mg/dL — ABNORMAL HIGH (ref 0.60–1.30)
ESTIMATED GFR: 29 mL/min/{1.73_m2} — ABNORMAL LOW (ref >59)
GLUCOSE: 95 mg/dL (ref 74–109)
OSMOLALITY, CALCULATED: 297 mOsm/kg — ABNORMAL HIGH (ref 270–290)
POTASSIUM: 3.2 mmol/L — ABNORMAL LOW (ref 3.5–5.1)
SODIUM: 145 mmol/L (ref 136–145)

## 2022-12-06 ENCOUNTER — Inpatient Hospital Stay
Admission: RE | Admit: 2022-12-06 | Discharge: 2022-12-06 | Disposition: A | Discharge status: Home / Self Care | Payer: Medicare Other | Source: Ambulatory Visit | Attending: PHYSICAL MEDICINE AND REHABILITATION | Admitting: PHYSICAL MEDICINE AND REHABILITATION

## 2022-12-06 DIAGNOSIS — R1319 Other dysphagia: Secondary | ICD-10-CM

## 2022-12-06 DIAGNOSIS — R1310 Dysphagia, unspecified: Secondary | ICD-10-CM | POA: Insufficient documentation

## 2022-12-06 DIAGNOSIS — K219 Gastro-esophageal reflux disease without esophagitis: Secondary | ICD-10-CM

## 2022-12-06 DIAGNOSIS — R410 Disorientation, unspecified: Secondary | ICD-10-CM

## 2022-12-06 DIAGNOSIS — R6339 Other feeding difficulties: Secondary | ICD-10-CM

## 2022-12-06 MED ORDER — BARIUM SULFATE 81 % (W/W) ORAL POWDER
30.0000 mL | ORAL | Status: AC
Start: 2022-12-06 — End: 2022-12-06
  Administered 2022-12-06: 30 mL via ORAL

## 2022-12-06 MED ORDER — BARIUM SULFATE 40 % (W/V), 30% (W/W) ORAL PASTE
30.0000 mL | PASTE | ORAL | Status: AC
Start: 2022-12-06 — End: 2022-12-06
  Administered 2022-12-06: 30 mL via ORAL

## 2022-12-06 NOTE — Speech Evaluation (Signed)
[facility]  [address]  [place], [address]  [phone]  (Fax) [phone]  Rehabilitation Services    Outpatient Speech Language Evaluation   Area of assessment:    OP MODIFIED BARIUM SWALLOW STUDY      Patient Name: Kurt Hayes  Date of Birth: 1929/11/05  Patient MRN: [id number]  Date of Speech/Language Evaluation: 12/06/2022  Referring Practitioner: Dr. Jacqualyn Posey    History & Current Problem   Kurt Hayes is a 89 y.o. male He is seen from [facility]. His family is in attendance with him.    ICD-10-CM    1. Dysphagia, unspecified type  R13.10         Past Medical History:   Diagnosis Date    Asthma     Esophageal reflux     Essential hypertension     Hearing loss      Recent CXR: No results found for this or any previous visit (from the past 720 hour(s)).    Subjective:   Patient subjective: Patient sitting upright in wheelchair. He did not respond to questions. He sits with eyes closed but is awake.  Pertinent Diagnosis/Reason for Speech/Language Evaluation: suspected aspiration   Baseline communication level prior to onset of illness: unchanged   Orientation: impairment of person, impairment of place, and impairment of time  Mental Status: alert, confused, and uncooperative  Hearing: Appears WFL/adequate for communication   Vision: Appears WFL on observation    Assessment:      12/06/22 1000   Rehab Session   Document Type evaluation   SLP Visit Date 12/06/22   Total SLP Minutes: 30   Patient Effort poor   General Information   Patient Profile Reviewed yes   Onset of Illness/Injury or Date of Surgery 12/06/22   Referring Physician Dr. Jacqualyn Posey, [facility]   General Observations of Patient Patient seated in wheelchair for lateral view of swallow fucntion. He sits with head down eyes closed. SLP proved MAX cues and MUCH persuation for him to take several swallows.   Oral Motor Structure and Function    Additional Documentation    (Patient refused to follow any instructions re Oral Motor strength and control)   Swallow Observations (VFSS)   Positioning Needs for Swallow Exam (VFSS) Patient seated upright in wheelchair   Radiologic Views Used for Examination (VFSS) left lateral view   Radiologist Performing Test (VFSS) Dr. Franki Monte   Thin Liquid Trial (VFSS)   Mode of Presentation, Thin Liquid (VFSS) fed by clinician   Volume Presented in mL, Thin Liquid (VFSS) 5 mL;10 mL   Oral Transit Impairment, Thin Liquid (VFSS) decreased efficiency, bolus oral transit;oral transit of bolus into hypopharynx impaired;premature spillage into pharynx   Oral Control Impairments, Thin Liquid (VFSS) lip seal is impaired   Pharyngeal Phase Results, Thin Liquid (VFSS) impaired pharyngeal phase of swallowing  (FLASSH amount of penetration below level of vocal cords is observed with Thin liquids sip via straw. Patient refused trial from cup.)   Hyolaryngeal Function, Thin Liquid (VFSS) laryngeal closure impaired   Upper Esophageal Sphincter Function, Thin Liquid (VFSS) WFL   Response to Aspiration, Thin Liquid (VFSS) nonproductive volitional cough following clinician cue   Epiglottic Movement Pattern, Thin Liquid (VFSS) functional but delayed   Identified Consequences of Impaired Swallow Physiology, Thin Liquid (VFSS) airway compromise;aspiration;bolus residue in pharynx   Attempted Compensatory Maneuvers, Thin Liquid (VFSS)   (He refused to try any)   Puree Trial (  VFSS)   Mode of Presentation, Puree (VFSS) fed by clinician   Oral Phase Results, Puree (VFSS) impaired oral phase, signs of dysfunction present;intact oral phase without signs of dysfunction   Pharyngeal Phase Results, Puree (VFSS) safe swallow, no signs/symptoms of aspiration or penetration   Hyolaryngeal Function, Puree (VFSS)   (laryngeal closure observed)   Upper Esophageal Sphincter Function, Puree (VFSS) WFL   Swallow Recommendations (VFSS)   Feeding Assistance (VFSS) needs constant  supervision during self-feeding activity   Recommended Liquid Modification (VFSS) thicken liquids to nectar consistency   Rationale for Liquid Modification (VFSS) allow formation and increased cohesion of bolus;decrease risk of airway penetration/compromise;enhance swallow function;slow liquid bolus transit   Recommended Food Modification (VFSS) level 1, dysphagia pureed diet   Rationale for Food Modification (VFSS) allow formation and increased cohesion of bolus;decrease the need for chewing;decrease risk of airway penetration/compromise;enhance swallow function   Plan of Care Review   Plan Of Care Reviewed With family   SLP Clinical Impression   Assessment Patient is 89 yr old male seen today to further evaluate swallow function. Patient is very reluctant to participate. He refused liquids from cup.He did take sip from straw and pureed consistency with MAX amount of encouragement. He did exhibit FLASH amount of aspirataion below level of vocal cords. He sits largely with his head down at all times. Recommend: PUREED DIET THICK LIQUIDS to Nectar consistency.   Swallowing Clinical Impression   SLP Swallowing Diagnosis oral dysfunction;pharyngeal dysfunction   Recommended Feeding/Eating Techniques (Swallow Eval) maintain upright sitting position for eating;maintain upright posture during/after eating for 30 minutes;minimize distractions during oral intake;provide assist with feeding   Signs/Symptoms of Aspiration Noted (Swallowing) cough   Anticipated Discharge Disposition home with assist   Plan of care reviewed with: Family;Patient   Eating/Swallowing Management Strategies/Techniques   Diet Consistency Recommendations pureed (dysphagia pureed);mildly thick (nectar-thick) liquids   Strategies to Enhance Eating/Swallowing allow adequate time for eating;alternate food and liquid swallows;no straw use for liquid intake;observe closely for symptoms of aspiration;provide positioning assistance to enhance  swallowing;upright sitting position for eating;use of clearing strategies to minimize oral residue;match rate of feeding to patient's ability   Dysphagia Goals, SLP   Date Established (Dysphagia Goal, SLP)   (Patient judged to NOT be a candidate for further treatment based on his level of participation/agitation)       Conclusions:   Treatment Diagnosis: Moderate Dysphagia  Problems list supporting diagnosis and recommendations: Difficulty expressing wants/needs  Rehab Potential: Guarded       Plan:   Recommended Diagnostics: N/A  Results & Recommendations/ Education provided to: Family   Is Speech and/or Language intervention warranted?: No, Reason: His agitation and refusals   Patient would benefit from the following referrals: N/A      Therapist:   Santina Evans L. Jarvis Newcomer, CCC-SLP  12/06/2022 15:42  Evaluation Time: 30 minutes

## 2022-12-07 LAB — ADULT ROUTINE BLOOD CULTURE, SET OF 2 BOTTLES (BACTERIA AND YEAST)
BLOOD CULTURE, ROUTINE: NO GROWTH
BLOOD CULTURE, ROUTINE: NO GROWTH

## 2023-03-10 ENCOUNTER — Encounter (INDEPENDENT_AMBULATORY_CARE_PROVIDER_SITE_OTHER): Payer: Self-pay | Admitting: OTOLARYNGOLOGY

## 2023-03-10 ENCOUNTER — Encounter (INDEPENDENT_AMBULATORY_CARE_PROVIDER_SITE_OTHER): Payer: Self-pay

## 2023-04-10 ENCOUNTER — Other Ambulatory Visit (INDEPENDENT_AMBULATORY_CARE_PROVIDER_SITE_OTHER): Payer: Self-pay | Admitting: OTOLARYNGOLOGY

## 2023-05-25 DEATH — deceased
